# Patient Record
Sex: Female | Born: 1985 | Race: White | Hispanic: No | Marital: Single | Smoking: Current every day smoker
Health system: Southern US, Community
[De-identification: ages and names within clinical notes are randomized; demographics above are authoritative.]

## PROBLEM LIST (undated history)

## (undated) DIAGNOSIS — Z789 Other specified health status: Secondary | ICD-10-CM

---
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
## 2004-02-17 ENCOUNTER — Inpatient Hospital Stay (HOSPITAL_COMMUNITY): Admission: AD | Admit: 2004-02-17 | Discharge: 2004-02-17 | Payer: Self-pay | Admitting: Family Medicine

## 2004-02-22 ENCOUNTER — Inpatient Hospital Stay (HOSPITAL_COMMUNITY): Admission: AD | Admit: 2004-02-22 | Discharge: 2004-02-22 | Payer: Self-pay | Admitting: Obstetrics and Gynecology

## 2004-02-24 ENCOUNTER — Other Ambulatory Visit: Admission: RE | Admit: 2004-02-24 | Discharge: 2004-02-24 | Payer: Self-pay | Admitting: Obstetrics and Gynecology

## 2004-04-11 ENCOUNTER — Inpatient Hospital Stay (HOSPITAL_COMMUNITY): Admission: AD | Admit: 2004-04-11 | Discharge: 2004-04-11 | Payer: Self-pay | Admitting: Obstetrics and Gynecology

## 2004-04-17 ENCOUNTER — Inpatient Hospital Stay (HOSPITAL_COMMUNITY): Admission: AD | Admit: 2004-04-17 | Discharge: 2004-04-17 | Payer: Self-pay | Admitting: Obstetrics and Gynecology

## 2004-07-10 ENCOUNTER — Inpatient Hospital Stay (HOSPITAL_COMMUNITY): Admission: AD | Admit: 2004-07-10 | Discharge: 2004-07-10 | Payer: Self-pay | Admitting: Obstetrics & Gynecology

## 2004-07-20 ENCOUNTER — Inpatient Hospital Stay (HOSPITAL_COMMUNITY): Admission: AD | Admit: 2004-07-20 | Discharge: 2004-07-20 | Payer: Self-pay | Admitting: Obstetrics & Gynecology

## 2004-09-15 ENCOUNTER — Inpatient Hospital Stay (HOSPITAL_COMMUNITY): Admission: AD | Admit: 2004-09-15 | Discharge: 2004-09-15 | Payer: Self-pay | Admitting: Obstetrics and Gynecology

## 2004-10-07 ENCOUNTER — Inpatient Hospital Stay (HOSPITAL_COMMUNITY): Admission: AD | Admit: 2004-10-07 | Discharge: 2004-10-09 | Payer: Self-pay | Admitting: Obstetrics & Gynecology

## 2006-03-27 ENCOUNTER — Inpatient Hospital Stay (HOSPITAL_COMMUNITY): Admission: AD | Admit: 2006-03-27 | Discharge: 2006-03-27 | Payer: Self-pay | Admitting: Gynecology

## 2006-06-30 ENCOUNTER — Inpatient Hospital Stay (HOSPITAL_COMMUNITY): Admission: AD | Admit: 2006-06-30 | Discharge: 2006-06-30 | Payer: Self-pay | Admitting: Obstetrics & Gynecology

## 2006-07-16 ENCOUNTER — Emergency Department (HOSPITAL_COMMUNITY): Admission: EM | Admit: 2006-07-16 | Discharge: 2006-07-16 | Payer: Self-pay | Admitting: Family Medicine

## 2007-02-10 ENCOUNTER — Emergency Department (HOSPITAL_COMMUNITY): Admission: EM | Admit: 2007-02-10 | Discharge: 2007-02-10 | Payer: Self-pay | Admitting: Emergency Medicine

## 2007-07-07 ENCOUNTER — Inpatient Hospital Stay (HOSPITAL_COMMUNITY): Admission: AD | Admit: 2007-07-07 | Discharge: 2007-07-07 | Payer: Self-pay | Admitting: Obstetrics & Gynecology

## 2007-07-28 ENCOUNTER — Inpatient Hospital Stay (HOSPITAL_COMMUNITY): Admission: RE | Admit: 2007-07-28 | Discharge: 2007-07-28 | Payer: Self-pay | Admitting: Obstetrics & Gynecology

## 2007-08-18 ENCOUNTER — Inpatient Hospital Stay (HOSPITAL_COMMUNITY): Admission: AD | Admit: 2007-08-18 | Discharge: 2007-08-18 | Payer: Self-pay | Admitting: Obstetrics and Gynecology

## 2007-11-18 ENCOUNTER — Inpatient Hospital Stay (HOSPITAL_COMMUNITY): Admission: AD | Admit: 2007-11-18 | Discharge: 2007-11-18 | Payer: Self-pay | Admitting: Obstetrics and Gynecology

## 2008-03-02 ENCOUNTER — Inpatient Hospital Stay (HOSPITAL_COMMUNITY): Admission: AD | Admit: 2008-03-02 | Discharge: 2008-03-03 | Payer: Self-pay | Admitting: Obstetrics and Gynecology

## 2008-03-02 ENCOUNTER — Encounter (INDEPENDENT_AMBULATORY_CARE_PROVIDER_SITE_OTHER): Payer: Self-pay | Admitting: Obstetrics and Gynecology

## 2008-07-18 ENCOUNTER — Emergency Department (HOSPITAL_COMMUNITY): Admission: EM | Admit: 2008-07-18 | Discharge: 2008-07-18 | Payer: Self-pay | Admitting: Family Medicine

## 2009-02-07 ENCOUNTER — Inpatient Hospital Stay (HOSPITAL_COMMUNITY): Admission: AD | Admit: 2009-02-07 | Discharge: 2009-02-08 | Payer: Self-pay | Admitting: Obstetrics & Gynecology

## 2009-02-08 ENCOUNTER — Emergency Department (HOSPITAL_COMMUNITY): Admission: EM | Admit: 2009-02-08 | Discharge: 2009-02-08 | Payer: Self-pay | Admitting: Emergency Medicine

## 2010-04-29 ENCOUNTER — Encounter: Payer: Self-pay | Admitting: *Deleted

## 2010-07-11 LAB — URINALYSIS, ROUTINE W REFLEX MICROSCOPIC
Glucose, UA: NEGATIVE mg/dL
Glucose, UA: NEGATIVE mg/dL
Hgb urine dipstick: NEGATIVE
Nitrite: NEGATIVE
Protein, ur: NEGATIVE mg/dL
Specific Gravity, Urine: 1.03 (ref 1.005–1.030)
Urobilinogen, UA: 0.2 mg/dL (ref 0.0–1.0)
pH: 5.5 (ref 5.0–8.0)

## 2010-07-11 LAB — URINE MICROSCOPIC-ADD ON

## 2010-08-21 NOTE — Discharge Summary (Signed)
Kristine [name], [name]               ACCOUNT NO.:  [id number]   MEDICAL RECORD NO.:  [id number]          PATIENT TYPE:  INP   LOCATION:  9143                          FACILITY:  [facility]   PHYSICIAN:  [name], [name] DATE OF BIRTH:  11-27-1985   DATE OF ADMISSION:  03/02/2008  DATE OF DISCHARGE:  03/03/2008                               DISCHARGE SUMMARY   A 25 year old white female para 1-0-2-1, gravida 4, 40 weeks and 3 days,  seen with contractions of increasing intensity and frequency.  No  leakage of fluid.  No vaginal bleeding.  Good fetal movement.  Uncomplicated prenatal course, except for positive antibody screen after  RhoGAM secondary to a first-trimester vaginal bleed.  Repeat screen was  negative.  The patient stated that she wanted a tubal ligation.   PAST MEDICAL HISTORY:  Negative.   SURGICAL HISTORY:  D&C x2.   OBSTETRICAL HISTORY:  First pregnancy, spontaneous abortion, D&C.  Second pregnancy, therapeutic abortion.  Third pregnancy, spontaneous  vaginal delivery, 6 pounds 9 ounce female.  The patient during this  pregnancy had a subchorionic hemorrhage with vaginal bleeding.  She had  no history of abnormal Paps or STDs.   MEDICATIONS:  Over-the-counter acid reducer.   SOCIAL HISTORY:  She did smoke.   FAMILY HISTORY:  Positive history of coronary artery disease in the  paternal grandfather.  High blood pressure in paternal grandmother.  Skin cancer in the mother.  Lung cancer, paternal grandmother.   She was O negative with a positive antibody screen.  Pap smear was  normal.  GC and chlamydia, negative; RPR nonreactive; rubella immune;  cystic fibrosis, negative; hepatitis B surface antigen, negative; HIV  negative; AFP normal; 1-hour Glucola 81; and Group B strep negative.   Ultrasound on October 05, 2007, placed.  Her EDC is February 27, 2008.  Aug 18, 2007, ultrasound showed a normal nuchal translucency.   PHYSICAL EXAMINATION:  GENERAL:  On admission, the  patient was afebrile  with normal vital signs.  HEART/LUNGS:  Normal.  ABDOMEN:  Soft and nontender.  Fetal heart tones were normal.  Cervix  was 3 cm, 80% vertex at a -1.   The patient was started on Pitocin.  Artificial rupture of membranes was  done with a small amount of clear fluid.  By 12:40 p.m., she had no  complaints.  She was comfortable with an epidural.  Cervix was 6 cm,  90%.  At 1:30 p.m., the cervix was 9 cm, 100%.  At 2:36 p.m., the  patient then progressed to full dilatation of +1 station.  She pushed  for approximately 10 minutes to deliver spontaneously a living female  infant, 7 pounds with Apgars of 6 at one at 8 at five minutes.  Infant  had a nuchal cord.  Placenta was manually extracted, inspected, and was  complete.  The patient was placed on cefotetan for 24 hours.  Second-  degree perineal laceration and left labial lacerations repaired with 3-0  Vicryl.  Blood loss was less than 500 mL.  The patient requested early  discharge because of Thanksgiving  and was discharged on the first  postpartum day.   Comprehensive metabolic profile was normal.  LDH was 141, SGOT 20, SGPT  12, and uric acid was 4.5.  Hemoglobin on admission 11.9, hematocrit  34.6, white count 18,200, and platelet count 214,000.  RPR was  nonreactive.  Follow up hemoglobin 10.5 and hematocrit 30.6.  The  patient's antibody screen is pending.  The baby is Rh positive and she  has got a negative screen.  She will receive RhoGAM prior to discharge.   FINAL DIAGNOSES:  Intrauterine pregnancy at 40+ weeks, delivered vertex.   OPERATION:  Spontaneous delivery vertex, repair of second-degree  perineal, and labial lacerations.   FINAL CONDITION:  Improved.   INSTRUCTIONS:  Our regular discharge instruction booklet.   MEDICATIONS:  1. Percocet 5/325, 20 tablets one every 4-6 hours as needed for pain.  2. Motrin 600 mg 20 tablets one every 6 hours as needed for pain.   FOLLOWUP:  The patient is  advised to return in 6 weeks for followup  examination.      [name], [name]  Electronically Signed     TFH/MEDQ  D:  03/03/2008  T:  03/03/2008  Job:  [id number]

## 2010-08-24 NOTE — Op Note (Signed)
NAME:  Kristine [name], Kristine [name]                         ACCOUNT NO.:  [id number]   MEDICAL RECORD NO.:  [id number]                   PATIENT TYPE:  AMB   LOCATION:  SDC                                  FACILITY:  [facility]   PHYSICIAN:  Freddy Finner, [name]                DATE OF BIRTH:  July 11, 1985   DATE OF PROCEDURE:  02/12/2003  DATE OF DISCHARGE:                                 OPERATIVE REPORT   PREOPERATIVE DIAGNOSIS:  Missed abortion.   POSTOPERATIVE DIAGNOSIS:  Missed abortion.   OPERATIVE PROCEDURE:  Dilation and evacuation.   SURGEON:  Freddy Finner, [name]   ANESTHESIA:  Managed intravenous sedation and paracervical block.   INTRAOPERATIVE COMPLICATIONS:  None.   ESTIMATED INTRAOPERATIVE BLOOD LOSS:  Less than 150 mL.   INTRAOPERATIVE COMPLICATIONS:  None.   The patient is a 25 year old who presented on February 11, 2003, with some  vaginal spotting.  She had a pelvic ultrasound showing an empty gestational  sac within the uterine cavity.  She is known to be Rh negative, and she was  given RhoGAM on the evening prior to surgery.  Due to the scant amount of  bleeding and the desirability of scheduled surgery after being NPO, she was  brought in this morning for D&E.   She was admitted on the morning of surgery, brought to the operating room,  there placed under adequate intravenous sedation, placed in the dorsal  lithotomy position using the [facility] stirrup system.  Betadine prep of the  mons, perineum, and vagina was carried out in the usual fashion.  A bivalve  speculum was introduced and the cervix was visualized.  Paracervical block  was placed using a total of 10 mL of 1% Xylocaine.  Injections were made at  4 and 8 o'clock in the vaginal fornices.  The cervix was grasped on the  anterior lip with a single-tooth tenaculum.  The cervix was then  progressively dilated to 19 with Pratt dilators.  The cervix was very rigid  and resistant to dilatation.  Attempts with the 21 Pratt  were unsuccessful,  producing a small tear on the anterior cervix as the single-tooth tenaculum  pulled off.  For this reason it was elected to explore the uterus with a  curette.  This was done and gentle sharp curettage and exploration with  polyp forceps was continued until it was felt that the cavity was completely  evacuated.  The procedure was then terminated.  A small amount of 1%  Xylocaine was injected to the small laceration on the anterior cervix, which  produced complete hemostasis.  At this point the procedure was terminated,  all instruments were removed, and the patient was taken to the recovery room  in good condition.  She will be discharged in the immediate postoperative  period for follow-up in the office or in the clinic in approximately one  week.  She is to use ibuprofen or Tylenol as needed for postoperative pain.                                              Freddy Finner, [name]   WRN/MEDQ  D:  02/12/2003  T:  02/12/2003  Job:  [id number]

## 2010-12-31 LAB — WET PREP, GENITAL
Clue Cells Wet Prep HPF POC: NONE SEEN
Trich, Wet Prep: NONE SEEN

## 2010-12-31 LAB — RH IMMUNE GLOBULIN WORKUP (NOT WOMEN'S HOSP)

## 2010-12-31 LAB — URINALYSIS, ROUTINE W REFLEX MICROSCOPIC
Glucose, UA: NEGATIVE
Hgb urine dipstick: NEGATIVE
Ketones, ur: NEGATIVE
Nitrite: NEGATIVE
Protein, ur: NEGATIVE
Urobilinogen, UA: 0.2

## 2010-12-31 LAB — GC/CHLAMYDIA PROBE AMP, GENITAL
Chlamydia, DNA Probe: NEGATIVE
GC Probe Amp, Genital: NEGATIVE

## 2010-12-31 LAB — POCT PREGNANCY, URINE
Operator id: 251141
Preg Test, Ur: POSITIVE

## 2010-12-31 LAB — HCG, QUANTITATIVE, PREGNANCY: hCG, Beta Chain, Quant, S: 9912 — ABNORMAL HIGH

## 2010-12-31 LAB — ABO/RH: ABO/RH(D): O NEG

## 2011-01-04 LAB — URINE MICROSCOPIC-ADD ON

## 2011-01-04 LAB — URINE CULTURE
Colony Count: NO GROWTH
Culture: NO GROWTH

## 2011-01-04 LAB — CBC
HCT: 34.3 — ABNORMAL LOW
MCHC: 34.4
MCV: 97.7
Platelets: 200
RDW: 13.7

## 2011-01-04 LAB — URINALYSIS, ROUTINE W REFLEX MICROSCOPIC: Protein, ur: 30 — AB

## 2011-01-08 LAB — CBC
HCT: 30.6 — ABNORMAL LOW
MCHC: 34.3
MCHC: 34.3
MCV: 91.3
MCV: 91.7
Platelets: 180
Platelets: 214
RBC: 3.33 — ABNORMAL LOW
RDW: 13.5
WBC: 14.8 — ABNORMAL HIGH

## 2011-01-08 LAB — COMPREHENSIVE METABOLIC PANEL
ALT: 12
AST: 20
Alkaline Phosphatase: 231 — ABNORMAL HIGH
CO2: 21
GFR calc Af Amer: >60
GFR calc non Af Amer: >60
Glucose, Bld: 89
Potassium: 4.2
Sodium: 135

## 2011-01-08 LAB — LACTATE DEHYDROGENASE: LDH: 141

## 2011-01-08 LAB — RH IMMUNE GLOB WKUP(>/=20WKS)(NOT WOMEN'S HOSP)

## 2011-01-22 ENCOUNTER — Emergency Department (HOSPITAL_COMMUNITY)
Admission: EM | Admit: 2011-01-22 | Discharge: 2011-01-23 | Disposition: A | Discharge status: Home / Self Care | Payer: No Typology Code available for payment source | Attending: Emergency Medicine | Admitting: Emergency Medicine

## 2011-01-22 DIAGNOSIS — M542 Cervicalgia: Secondary | ICD-10-CM | POA: Insufficient documentation

## 2011-01-22 DIAGNOSIS — M546 Pain in thoracic spine: Secondary | ICD-10-CM | POA: Insufficient documentation

## 2011-01-23 ENCOUNTER — Emergency Department (HOSPITAL_COMMUNITY): Payer: No Typology Code available for payment source

## 2011-02-09 ENCOUNTER — Emergency Department (HOSPITAL_COMMUNITY)
Admission: EM | Admit: 2011-02-09 | Discharge: 2011-02-10 | Disposition: A | Discharge status: Home / Self Care | Payer: No Typology Code available for payment source | Attending: Emergency Medicine | Admitting: Emergency Medicine

## 2011-02-09 ENCOUNTER — Emergency Department (HOSPITAL_COMMUNITY): Payer: No Typology Code available for payment source

## 2011-02-09 DIAGNOSIS — R209 Unspecified disturbances of skin sensation: Secondary | ICD-10-CM | POA: Insufficient documentation

## 2011-02-09 DIAGNOSIS — M542 Cervicalgia: Secondary | ICD-10-CM | POA: Insufficient documentation

## 2011-02-09 DIAGNOSIS — M62838 Other muscle spasm: Secondary | ICD-10-CM | POA: Insufficient documentation

## 2011-02-09 DIAGNOSIS — S139XXA Sprain of joints and ligaments of unspecified parts of neck, initial encounter: Secondary | ICD-10-CM | POA: Insufficient documentation

## 2011-02-09 DIAGNOSIS — M2560 Stiffness of unspecified joint, not elsewhere classified: Secondary | ICD-10-CM | POA: Insufficient documentation

## 2012-01-26 ENCOUNTER — Emergency Department (HOSPITAL_COMMUNITY)
Admission: EM | Admit: 2012-01-26 | Discharge: 2012-01-26 | Disposition: A | Discharge status: Home / Self Care | Payer: Self-pay | Attending: Emergency Medicine | Admitting: Emergency Medicine

## 2012-01-26 ENCOUNTER — Encounter (HOSPITAL_COMMUNITY): Payer: Self-pay | Admitting: *Deleted

## 2012-01-26 ENCOUNTER — Emergency Department (HOSPITAL_COMMUNITY): Payer: Self-pay

## 2012-01-26 DIAGNOSIS — R062 Wheezing: Secondary | ICD-10-CM | POA: Insufficient documentation

## 2012-01-26 DIAGNOSIS — R509 Fever, unspecified: Secondary | ICD-10-CM | POA: Insufficient documentation

## 2012-01-26 DIAGNOSIS — F17200 Nicotine dependence, unspecified, uncomplicated: Secondary | ICD-10-CM | POA: Insufficient documentation

## 2012-01-26 DIAGNOSIS — R059 Cough, unspecified: Secondary | ICD-10-CM | POA: Insufficient documentation

## 2012-01-26 DIAGNOSIS — J40 Bronchitis, not specified as acute or chronic: Secondary | ICD-10-CM | POA: Insufficient documentation

## 2012-01-26 DIAGNOSIS — R05 Cough: Secondary | ICD-10-CM | POA: Insufficient documentation

## 2012-01-26 LAB — POCT PREGNANCY, URINE: Preg Test, Ur: NEGATIVE

## 2012-01-26 MED ORDER — AZITHROMYCIN 250 MG PO TABS
500.0000 mg | ORAL_TABLET | Freq: Once | ORAL | Status: AC
  Administered 2012-01-26: 500 mg via ORAL
  Filled 2012-01-26: qty 2

## 2012-01-26 MED ORDER — AZITHROMYCIN 250 MG PO TABS: 250.0000 mg | ORAL_TABLET | Freq: Every day | ORAL | Status: DC

## 2012-01-26 MED ORDER — ALBUTEROL SULFATE HFA 108 (90 BASE) MCG/ACT IN AERS
2.0000 | INHALATION_SPRAY | RESPIRATORY_TRACT | Status: DC | PRN
  Administered 2012-01-26: 2 via RESPIRATORY_TRACT
  Filled 2012-01-26: qty 6.7

## 2012-01-26 NOTE — ED Provider Notes (Signed)
History     CSN: [id number]  Arrival date & time 01/26/12  1558   First MD Initiated Contact with Patient 01/26/12 1644      Chief Complaint  Patient presents with  . URI    (Consider location/radiation/quality/duration/timing/severity/associated sxs/prior treatment) HPI Comments: Patient reports, that she's had a routine URI for the past 3, weeks, but for the past 2, days.  She has developed a productive cough with green, blood tinged sputum, generalized myalgias, and low-grade fever at home.  She also reports, that she's had 2 months of left breast tenderness.  She currently is using Mirena or birth control.  She denies any vaginal discharge, or change in sexual partner  Patient is a 26 y.o. female presenting with URI. The history is provided by the patient.  URI The primary symptoms include fever, cough and wheezing. Primary symptoms do not include nausea or rash.  Symptoms associated with the illness include chills.    History reviewed. No pertinent past medical history.  History reviewed. No pertinent past surgical history.  History reviewed. No pertinent family history.  History  Substance Use Topics  . Smoking status: Current Every Day Smoker -- 1.0 packs/day for 10 years    Types: Cigarettes  . Smokeless tobacco: Not on file  . Alcohol Use: No    OB History    Grav Para Term Preterm Abortions TAB SAB Ect Mult Living                  Review of Systems  Constitutional: Positive for fever and chills.  Respiratory: Positive for cough and wheezing. Negative for shortness of breath.   Gastrointestinal: Negative for nausea.  Skin: Negative for rash and wound.    Allergies  Review of patient's allergies indicates no known allergies.  Home Medications   Current Outpatient Rx  Name Route Sig Dispense Refill  . ACETAMINOPHEN 500 MG PO TABS Oral Take 500 mg by mouth every 6 (six) hours as needed. Pain    . DM-DOXYLAMINE-ACETAMINOPHEN 15-6.25-325 MG PO CAPS Oral  Take 1 capsule by mouth daily.    . AZITHROMYCIN 250 MG PO TABS Oral Take 1 tablet (250 mg total) by mouth daily. 4 tablet 0    BP 123/74  Pulse 70  Temp 98.7 F (37.1 C) (Oral)  Resp 23  SpO2 100%  LMP 01/12/2012  Physical Exam  Constitutional: She appears well-developed and well-nourished.  HENT:  Head: Normocephalic.  Neck: Normal range of motion.  Cardiovascular: Normal rate.   Pulmonary/Chest: Effort normal. No respiratory distress. She has wheezes. She exhibits no tenderness.  Abdominal: Soft.  Musculoskeletal: Normal range of motion.  Neurological: She is alert.  Skin: Skin is warm. No erythema.    ED Course  Procedures (including critical care time)   Labs Reviewed  POCT PREGNANCY, URINE   Dg Chest 2 View  01/26/2012  *RADIOLOGY REPORT*  Clinical Data: Productive cough.  CHEST - 2 VIEW  Comparison: Thoracic spine 01/23/2011  Findings: Two views of the chest demonstrate hyperinflation of the lungs. Heart and mediastinum are within normal limits.  The trachea is midline.  No focal airspace disease and no evidence for effusions.  IMPRESSION: Hyperinflation without focal disease.   Original Report Authenticated By: Richarda Overlie, [name]      1. Bronchitis       MDM          Arman Filter, NP 01/26/12 1826

## 2012-01-26 NOTE — ED Notes (Addendum)
Pt report she has cold x3 weeks.  body aches, chills, weakness. Last night had fever, took tylenol. At present Green productive cough. Generalized pain 5/10.   Unrelated pt reports left breast has been slightly sore and tender to touch.

## 2012-01-27 NOTE — ED Provider Notes (Signed)
Medical screening examination/treatment/procedure(s) were performed by non-physician practitioner and as supervising physician I was immediately available for consultation/collaboration.  Yasheka Fossett R. Suhas Estis, MD 01/27/12 0013 

## 2012-11-09 ENCOUNTER — Emergency Department (INDEPENDENT_AMBULATORY_CARE_PROVIDER_SITE_OTHER)
Admission: EM | Admit: 2012-11-09 | Discharge: 2012-11-09 | Disposition: A | Discharge status: Home / Self Care | Payer: Self-pay | Source: Home / Self Care | Attending: Family Medicine | Admitting: Family Medicine

## 2012-11-09 ENCOUNTER — Encounter (HOSPITAL_COMMUNITY): Payer: Self-pay | Admitting: Emergency Medicine

## 2012-11-09 DIAGNOSIS — M7650 Patellar tendinitis, unspecified knee: Secondary | ICD-10-CM

## 2012-11-09 DIAGNOSIS — M7651 Patellar tendinitis, right knee: Secondary | ICD-10-CM

## 2012-11-09 MED ORDER — DICLOFENAC POTASSIUM 50 MG PO TABS: 50.0000 mg | ORAL_TABLET | Freq: Three times a day (TID) | ORAL | Status: DC

## 2012-11-09 NOTE — ED Provider Notes (Signed)
  CSN: [id number]     Arrival date & time 11/09/12  1303 History     First MD Initiated Contact with Patient 11/09/12 1417     Chief Complaint  Patient presents with  . Knee Pain   (Consider location/radiation/quality/duration/timing/severity/associated sxs/prior Treatment) Patient is a 27 y.o. female presenting with knee pain. The history is provided by the patient.  Knee Pain Location:  Knee (did a lot of squats yest painting doors at home) Injury: no   Knee location:  R knee Pain details:    Quality:  Burning   Radiates to:  Does not radiate   Severity:  Mild   Onset quality:  Gradual   Duration:  1 day   Progression:  Worsening Chronicity:  New Dislocation: no   Prior injury to area:  No Associated symptoms: no back pain     History reviewed. No pertinent past medical history. History reviewed. No pertinent past surgical history. History reviewed. No pertinent family history. History  Substance Use Topics  . Smoking status: Current Every Day Smoker -- 1.00 packs/day for 10 years    Types: Cigarettes  . Smokeless tobacco: Not on file  . Alcohol Use: No   OB History   Grav Para Term Preterm Abortions TAB SAB Ect Mult Living                 Review of Systems  Constitutional: Negative.   Musculoskeletal: Negative for back pain, joint swelling and gait problem.  Skin: Negative.     Allergies  Review of patient's allergies indicates no known allergies.  Home Medications   Current Outpatient Rx  Name  Route  Sig  Dispense  Refill  . acetaminophen (TYLENOL) 500 MG tablet   Oral   Take 500 mg by mouth every 6 (six) hours as needed. Pain         . azithromycin (ZITHROMAX Z-PAK) 250 MG tablet   Oral   Take 1 tablet (250 mg total) by mouth daily.   4 tablet   0   . diclofenac (CATAFLAM) 50 MG tablet   Oral   Take 1 tablet (50 mg total) by mouth 3 (three) times daily. For knee pain.   30 tablet   1   . DM-Doxylamine-Acetaminophen (VICKS NYQUIL  MULTI-SYMPTOM) 15-6.25-325 MG CAPS   Oral   Take 1 capsule by mouth daily.          BP 103/61  Pulse 64  Temp(Src) 97.8 F (36.6 C) (Oral)  Resp 16  SpO2 100%  LMP 10/26/2012 Physical Exam  Nursing note and vitals reviewed. Constitutional: She is oriented to person, place, and time. She appears well-developed and well-nourished.  Musculoskeletal: She exhibits tenderness.       Right knee: She exhibits normal range of motion, no swelling, no effusion, no LCL laxity, normal patellar mobility, no bony tenderness, normal meniscus and no MCL laxity. Tenderness found. Patellar tendon tenderness noted.       Legs: Neurological: She is alert and oriented to person, place, and time.  Skin: Skin is warm and dry.    ED Course   Procedures (including critical care time)  Labs Reviewed - No data to display No results found. 1. Tendonitis, patellar, right     MDM    Linna Hoff, MD 11/09/12 1452

## 2012-11-09 NOTE — ED Notes (Signed)
Report pain in right knee. 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 Pain started after work last night in her knee.  Pt describes the pain a constant throbbing. With waking this a.m pt was unable to bear full weight on knee "gave away".  Pt has been using ibuprofen and muscle relaxers with no relief in pain.

## 2012-11-13 ENCOUNTER — Encounter (HOSPITAL_COMMUNITY): Payer: Self-pay | Admitting: *Deleted

## 2012-11-13 ENCOUNTER — Emergency Department (HOSPITAL_COMMUNITY): Payer: Self-pay

## 2012-11-13 ENCOUNTER — Emergency Department (HOSPITAL_COMMUNITY)
Admission: EM | Admit: 2012-11-13 | Discharge: 2012-11-13 | Disposition: A | Discharge status: Home / Self Care | Payer: Self-pay | Attending: Emergency Medicine | Admitting: Emergency Medicine

## 2012-11-13 DIAGNOSIS — R110 Nausea: Secondary | ICD-10-CM | POA: Insufficient documentation

## 2012-11-13 DIAGNOSIS — Z3202 Encounter for pregnancy test, result negative: Secondary | ICD-10-CM | POA: Insufficient documentation

## 2012-11-13 DIAGNOSIS — F17200 Nicotine dependence, unspecified, uncomplicated: Secondary | ICD-10-CM | POA: Insufficient documentation

## 2012-11-13 DIAGNOSIS — R109 Unspecified abdominal pain: Secondary | ICD-10-CM | POA: Insufficient documentation

## 2012-11-13 DIAGNOSIS — K5900 Constipation, unspecified: Secondary | ICD-10-CM | POA: Insufficient documentation

## 2012-11-13 DIAGNOSIS — Z79899 Other long term (current) drug therapy: Secondary | ICD-10-CM | POA: Insufficient documentation

## 2012-11-13 LAB — CBC WITH DIFFERENTIAL/PLATELET
Basophils Relative: 0 % (ref 0–1)
Eosinophils Absolute: 0.1 10*3/uL (ref 0.0–0.7)
Eosinophils Relative: 1 % (ref 0–5)
HCT: 41.6 % (ref 36.0–46.0)
Hemoglobin: 14.6 g/dL (ref 12.0–15.0)
Lymphs Abs: 1.3 10*3/uL (ref 0.7–4.0)
MCH: 33 pg (ref 26.0–34.0)
MCHC: 35.1 g/dL (ref 30.0–36.0)
MCV: 93.9 fL (ref 78.0–100.0)
Monocytes Absolute: 0.5 10*3/uL (ref 0.1–1.0)
Monocytes Relative: 5 % (ref 3–12)
RBC: 4.43 MIL/uL (ref 3.87–5.11)

## 2012-11-13 LAB — COMPREHENSIVE METABOLIC PANEL
ALT: 13 U/L (ref 0–35)
AST: 17 U/L (ref 0–37)
Alkaline Phosphatase: 68 U/L (ref 39–117)
Calcium: 9.2 mg/dL (ref 8.4–10.5)
GFR calc Af Amer: >90 mL/min (ref >90)
Glucose, Bld: 102 mg/dL — ABNORMAL HIGH (ref 70–99)
Potassium: 3.9 mEq/L (ref 3.5–5.1)
Sodium: 139 mEq/L (ref 135–145)
Total Protein: 7.4 g/dL (ref 6.0–8.3)

## 2012-11-13 LAB — URINALYSIS, ROUTINE W REFLEX MICROSCOPIC
Bilirubin Urine: NEGATIVE
Glucose, UA: NEGATIVE mg/dL
Hgb urine dipstick: NEGATIVE
Protein, ur: NEGATIVE mg/dL
Urobilinogen, UA: 0.2 mg/dL (ref 0.0–1.0)

## 2012-11-13 LAB — PREGNANCY, URINE: Preg Test, Ur: NEGATIVE

## 2012-11-13 MED ORDER — IBUPROFEN 800 MG PO TABS
800.0000 mg | ORAL_TABLET | Freq: Once | ORAL | Status: AC
  Administered 2012-11-13: 800 mg via ORAL
  Filled 2012-11-13: qty 1

## 2012-11-13 MED ORDER — SODIUM CHLORIDE 0.9 % IV BOLUS (SEPSIS)
1000.0000 mL | Freq: Once | INTRAVENOUS | Status: AC
  Administered 2012-11-13: 1000 mL via INTRAVENOUS

## 2012-11-13 MED ORDER — HYDROMORPHONE HCL PF 1 MG/ML IJ SOLN
1.0000 mg | Freq: Once | INTRAMUSCULAR | Status: AC
  Administered 2012-11-13: 1 mg via INTRAVENOUS
  Filled 2012-11-13: qty 1

## 2012-11-13 MED ORDER — SIMETHICONE 80 MG PO CHEW
160.0000 mg | CHEWABLE_TABLET | Freq: Once | ORAL | Status: AC
  Administered 2012-11-13: 160 mg via ORAL
  Filled 2012-11-13: qty 2

## 2012-11-13 MED ORDER — IOHEXOL 300 MG/ML  SOLN
100.0000 mL | Freq: Once | INTRAMUSCULAR | Status: AC | PRN
  Administered 2012-11-13: 80 mL via INTRAVENOUS

## 2012-11-13 MED ORDER — BISACODYL 5 MG PO TBEC: 5.0000 mg | DELAYED_RELEASE_TABLET | Freq: Every day | ORAL | Status: DC | PRN

## 2012-11-13 MED ORDER — FENTANYL CITRATE 0.05 MG/ML IJ SOLN: 100.0000 ug | Freq: Once | INTRAMUSCULAR | Status: DC

## 2012-11-13 MED ORDER — IOHEXOL 300 MG/ML  SOLN
50.0000 mL | Freq: Once | INTRAMUSCULAR | Status: AC | PRN
  Administered 2012-11-13: 50 mL via ORAL

## 2012-11-13 MED ORDER — ONDANSETRON HCL 4 MG/2ML IJ SOLN
4.0000 mg | Freq: Once | INTRAMUSCULAR | Status: AC
  Administered 2012-11-13: 4 mg via INTRAVENOUS
  Filled 2012-11-13: qty 2

## 2012-11-13 NOTE — ED Notes (Signed)
Urine recollected and sent to lab for testing.

## 2012-11-13 NOTE — Progress Notes (Signed)
Patient confirms that she does not have a pcp or insurance.  [facility] provided patient a  list of pcps who accept self pay patients, list of discount pharmacies, website needymeds.org and provided RX discount card for medication assistance.  Also provided information regarding Affordable Care Act and Medicaid for insurance.  EDCM also provided patient information for orange card.  Explained to patient that the orange card is not insurance.  Also provided patient a list of financial community assistance such as local churches and [employer].  Strongly encouraged patient to find a pcp.  Patient verbalized understanding.  No further needs at this time.

## 2012-11-13 NOTE — ED Provider Notes (Signed)
CSN: [id number]     Arrival date & time 11/13/12  1849 History     First MD Initiated Contact with Patient 11/13/12 1852     Chief Complaint  Patient presents with  . Abdominal Pain   (Consider location/radiation/quality/duration/timing/severity/associated sxs/prior Treatment) HPI Kristine [name] is a 27 y.o.female without any significant PMH presents to the ER with complaints of abdominal pain that started early today and is severe with nausea. She admits to taking a friend muscle relaxers x 2 and is concerned that this might have caused constipation. Her LBM was yesterday. The pain started in the RUQ but has moved to the epigastric and periumbical region. It is waxing and waning but getting stronger. She denies ever having an abdominal surgery in the past or hx of abdominal pains. She tried using a laxative, enema, prune juice and OTC medications to control pain but denies these being effective.    History reviewed. No pertinent past medical history. History reviewed. No pertinent past surgical history. No family history on file. History  Substance Use Topics  . Smoking status: Current Every Day Smoker -- 1.00 packs/day for 10 years    Types: Cigarettes  . Smokeless tobacco: Not on file  . Alcohol Use: No   OB History   Grav Para Term Preterm Abortions TAB SAB Ect Mult Living                 Review of Systems  Review of Systems  Gen: no weight loss, fevers, chills, night sweats  Eyes: no discharge or drainage, no occular pain or visual changes  Nose: no epistaxis or rhinorrhea  Mouth: no dental pain, no sore throat  Neck: no neck pain  Lungs:No wheezing, coughing or hemoptysis CV: no chest pain, palpitations, dependent edema or orthopnea  Abd: + abdominal pain, nausea,  NO vomiting  GU: no dysuria or gross hematuria  MSK:  No abnormalities  Neuro: no headache, no focal neurologic deficits  Skin: no abnormalities Psyche: negative.   Allergies  Review of patient's  allergies indicates no known allergies.  Home Medications   Current Outpatient Rx  Name  Route  Sig  Dispense  Refill  . acetaminophen (TYLENOL) 500 MG tablet   Oral   Take 500 mg by mouth every 6 (six) hours as needed. Pain         . cyclobenzaprine (FLEXERIL) 10 MG tablet   Oral   Take 10 mg by mouth 3 (three) times daily as needed for muscle spasms.         . sodium phosphate Pediatric (FLEET) 3.5-9.5 GM/59ML enema   Rectal   Place 1 enema rectally once.         . bisacodyl (DULCOLAX) 5 MG EC tablet   Oral   Take 1 tablet (5 mg total) by mouth daily as needed for constipation.   14 tablet   0   . diclofenac (CATAFLAM) 50 MG tablet   Oral   Take 1 tablet (50 mg total) by mouth 3 (three) times daily. For knee pain.   30 tablet   1    BP 124/77  Pulse 90  Temp(Src) 99.8 F (37.7 C) (Oral)  Resp 20  SpO2 100%  LMP 10/26/2012 Physical Exam  Nursing note and vitals reviewed. Constitutional: She appears well-developed and well-nourished. No distress.  HENT:  Head: Normocephalic and atraumatic.  Eyes: Pupils are equal, round, and reactive to light.  Neck: Normal range of motion. Neck supple.  Cardiovascular: Normal  rate and regular rhythm.   Pulmonary/Chest: Effort normal.  Abdominal: Soft. She exhibits no distension and no ascites. There is tenderness in the right upper quadrant, epigastric area and periumbilical area. There is no rigidity, no rebound, no CVA tenderness and negative Murphy's sign.  Genitourinary: Rectum normal.  Neurological: She is alert.  Skin: Skin is warm and dry.    ED Course   Procedures (including critical care time)  Labs Reviewed  COMPREHENSIVE METABOLIC PANEL - Abnormal; Notable for the following:    Glucose, Bld 102 (*)    All other components within normal limits  CBC WITH DIFFERENTIAL - Abnormal; Notable for the following:    WBC 11.0 (*)    Neutrophils Relative % 82 (*)    Neutro Abs 9.0 (*)    All other components  within normal limits  URINALYSIS, ROUTINE W REFLEX MICROSCOPIC - Abnormal; Notable for the following:    Ketones, ur 15 (*)    All other components within normal limits  LIPASE, BLOOD  PREGNANCY, URINE   Ct Abdomen Pelvis W Contrast  11/13/2012   *RADIOLOGY REPORT*  Clinical Data: Severe right-sided abdominal pain and nausea.  CT ABDOMEN AND PELVIS WITH CONTRAST  Technique:  Multidetector CT imaging of the abdomen and pelvis was performed following the standard protocol during bolus administration of intravenous contrast.  Contrast: 80 mL of Omnipaque 300 IV contrast  Comparison: Pelvic ultrasound performed 07/07/2007  Findings: The visualized lung bases are clear.  There is distension of the common hepatic duct to 0.8 cm in maximal diameter, with prominence of the intrahepatic biliary ducts; this is of uncertain significance.  No definite distal obstructing stone or mass is seen.  The gallbladder remains normal in size.  There is no evidence for cholecystitis.  The liver and spleen are otherwise unremarkable in appearance.  The pancreas and adrenal glands are unremarkable.  The kidneys are unremarkable in appearance.  There is no evidence of hydronephrosis.  No renal or ureteral stones are seen.  No perinephric stranding is appreciated.  No free fluid is identified.  The small bowel is unremarkable in appearance.  The stomach is within normal limits.  No acute vascular abnormalities are seen.  The appendix is normal in caliber and filled with air, seen tracking to the inferior tip of the liver.  There is no evidence for appendicitis.  The colon is partially filled with liquid stool; it is unremarkable in appearance.  The bladder is decompressed and not well assessed.  The uterus is grossly unremarkable appearance.  An intrauterine device is noted at the fundus of the uterus.  No inguinal lymphadenopathy is seen.  No acute osseous abnormalities are identified.  IMPRESSION:  1.  Distension of the common  hepatic duct to 0.8 cm in maximal diameter, with prominence of the intrahepatic biliary ducts.  This is of uncertain significance.  The gallbladder remains normal in size, and there is no evidence for cholecystitis.  No definite distal obstructing stone or mass is seen.  MRCP could be considered for further evaluation, when and as deemed clinically appropriate. 2.  Otherwise unremarkable CT of the abdomen and pelvis.   Original Report Authenticated By: Tonia Ghent, [name]   1. Abdominal pain     MDM  Patient has had 6 bowel movements since in the ED before CT abd/pelv. Has significant relief of her symptoms. She says that she is passing some gas and feels very bloated. No tenderness to RUQ as CT noted some mild biliary duct  dilatation of unknown significance. Highly down gall bladder disease with the multiple episodes of loose bowel and gas.  Will stay away from narcotics to prevent further constipation and give short course of dulcolax .  26 y.o.Tanikka Bresnan Vetsch's evaluation in the Emergency Department is complete. It has been determined that no acute conditions requiring further emergency intervention are present at this time. The patient/guardian have been advised of the diagnosis and plan. We have discussed signs and symptoms that warrant return to the ED, such as changes or worsening in symptoms.  Vital signs are stable at discharge. Filed Vitals:   11/13/12 1857  BP: 124/77  Pulse: 90  Temp: 99.8 F (37.7 C)  Resp: 20    Patient/guardian has voiced understanding and agreed to follow-up with the PCP or specialist.   Dorthula Matas, PA-C 11/13/12 2254

## 2012-11-13 NOTE — ED Notes (Signed)
Pt reports severe R side pain which started early today, pt reports nausea but denies any vomiting at this time.  Pt reports pain radiates to her mid-abdomen.  LBM-yesterday.  Pt reports using laxatives without relief.

## 2012-11-14 NOTE — ED Provider Notes (Signed)
Medical screening examination/treatment/procedure(s) were performed by non-physician practitioner and as supervising physician I was immediately available for consultation/collaboration.  Aston Lawhorn T Seann Genther, MD 11/14/12 1518 

## 2013-03-29 ENCOUNTER — Emergency Department (HOSPITAL_COMMUNITY)
Admission: EM | Admit: 2013-03-29 | Discharge: 2013-03-29 | Disposition: A | Discharge status: Home / Self Care | Payer: Self-pay | Attending: Emergency Medicine | Admitting: Emergency Medicine

## 2013-03-29 ENCOUNTER — Encounter (HOSPITAL_COMMUNITY): Payer: Self-pay | Admitting: Emergency Medicine

## 2013-03-29 DIAGNOSIS — J9801 Acute bronchospasm: Secondary | ICD-10-CM | POA: Insufficient documentation

## 2013-03-29 DIAGNOSIS — R110 Nausea: Secondary | ICD-10-CM | POA: Insufficient documentation

## 2013-03-29 DIAGNOSIS — R197 Diarrhea, unspecified: Secondary | ICD-10-CM | POA: Insufficient documentation

## 2013-03-29 DIAGNOSIS — IMO0001 Reserved for inherently not codable concepts without codable children: Secondary | ICD-10-CM | POA: Insufficient documentation

## 2013-03-29 DIAGNOSIS — F17200 Nicotine dependence, unspecified, uncomplicated: Secondary | ICD-10-CM | POA: Insufficient documentation

## 2013-03-29 DIAGNOSIS — J111 Influenza due to unidentified influenza virus with other respiratory manifestations: Secondary | ICD-10-CM | POA: Insufficient documentation

## 2013-03-29 DIAGNOSIS — R52 Pain, unspecified: Secondary | ICD-10-CM | POA: Insufficient documentation

## 2013-03-29 MED ORDER — ALBUTEROL SULFATE HFA 108 (90 BASE) MCG/ACT IN AERS
2.0000 | INHALATION_SPRAY | RESPIRATORY_TRACT | Status: DC | PRN
  Filled 2013-03-29: qty 6.7

## 2013-03-29 MED ORDER — AEROCHAMBER Z-STAT PLUS/MEDIUM MISC: 1.0000 | Freq: Once | Status: DC

## 2013-03-29 NOTE — ED Notes (Signed)
Pt c/o fever, muscle aches, productive cough since Wednesday. Pt also reports she has had some stomach cramps and diarrhea over the past few days. Pt states she has been nauseous, but hasn't vomited. Pt has been taking Tylenol and Mucinex for symptoms. Not getting better. Pt with no acute distress.

## 2013-03-29 NOTE — ED Provider Notes (Addendum)
CSN: [id number]     Arrival date & time 03/29/13  1537 History  This chart was scribed for non-physician practitioner, Earley Favor, NP, working with Raeford Razor, MD by Smiley Houseman, ED Scribe. This patient was seen in room WTR8/WTR8 and the patient's care was started at 4:02 PM.  Chief Complaint  Patient presents with  . URI  . Diarrhea    The history is provided by the patient. No language interpreter was used.   HPI Comments: Kristine [name] is a 27 y.o. female who presents to the Emergency Department complaining of constant worsening URI symptoms and associated diarrhea, weakness, congestion, sneezing, and generalized body aches, that all started at the same time.   Pt states "I feel like hell."  She reports 2 episodes daily of diarrhea described as liquidy stool.  She is nauseated but denies vomiting or concern for dehydration.  She states she has had a fever of 102F (ED temperature 98.23F) and a non-productive cough.  Pt states she has taken Tylenol, Mucinex, and Nyquil without relief.  Pt also reports possible wheezing for about 2 months ago and has been diagnosed with either bronchitis or asthma, but she does not use any breathing treatment at home. She is a current smoker.     History reviewed. No pertinent past medical history.  History reviewed. No pertinent past surgical history.  No family history on file.  History  Substance Use Topics  . Smoking status: Current Every Day Smoker -- 1.00 packs/day for 10 years    Types: Cigarettes  . Smokeless tobacco: Not on file  . Alcohol Use: No   OB History   Grav Para Term Preterm Abortions TAB SAB Ect Mult Living                 Review of Systems  Constitutional: Positive for fever and chills.  HENT: Positive for congestion and rhinorrhea.   Respiratory: Positive for cough and wheezing. Negative for shortness of breath.   Gastrointestinal: Positive for nausea and diarrhea. Negative for vomiting.  Genitourinary: Negative for  dysuria and decreased urine volume.  Musculoskeletal: Positive for myalgias.  Skin: Negative for rash.  All other systems reviewed and are negative.    Allergies  Review of patient's allergies indicates no known allergies.  Home Medications   Current Outpatient Rx  Name  Route  Sig  Dispense  Refill  . acetaminophen (TYLENOL) 500 MG tablet   Oral   Take 500 mg by mouth every 6 (six) hours as needed. Pain         . guaiFENesin (MUCINEX) 600 MG 12 hr tablet   Oral   Take 600 mg by mouth 2 (two) times daily as needed (cold and cough).         . Pseudoeph-Doxylamine-DM-APAP (NYQUIL MULTI-SYMPTOM PO)   Oral   Take 1 capsule by mouth at bedtime as needed (cold and cough).          Triage Vitals: BP 107/75  Pulse 75  Temp(Src) 98.8 F (37.1 C) (Oral)  Resp 16  SpO2 100% Physical Exam  Vitals reviewed. Constitutional: She is oriented to person, place, and time. She appears well-developed and well-nourished.  HENT:  Head: Normocephalic.  Eyes: Pupils are equal, round, and reactive to light.  Neck: Normal range of motion.  Cardiovascular: Normal rate and regular rhythm.   Pulmonary/Chest: Effort normal and breath sounds normal.  Abdominal: Soft. Bowel sounds are normal. There is no tenderness.  Neurological: She is alert and  oriented to person, place, and time.  Skin: Skin is warm.    ED Course  Procedures (including critical care time) DIAGNOSTIC STUDIES: Oxygen Saturation is 100% on RA, normal by my interpretation.    COORDINATION OF CARE: 4:08 PM- Will order albuterol and a spacer. Patient informed of current plan of treatment and evaluation and agrees with plan.     Labs Review Labs Reviewed - No data to display Imaging Review No results found.  EKG Interpretation   None       MDM   1. Influenza   2. Bronchospasm     I personally performed the services described in this documentation, which was scribed in my presence. The recorded information  has been reviewed and is accurate. Patient states, that she started noticing she has wheezing.  Several months ago, with exertion.  She is a smoker.  Denies any history of, asthma as a child. At this time.  She has influenza.  That is exacerbating her, bronchospasm.  She's been given an inhaler that she has been instructed in its use with a spacer.  She is to use 2 puffs every 4-6 hours.  A regular basis for the next 2 days while awake and then as needed thereafter.  Have also encouraged her to stop smoking  Arman Filter, NP 03/29/13 1637  Arman Filter, NP 03/29/13 1638  Arman Filter, NP 04/12/13 [phone]

## 2013-03-29 NOTE — ED Notes (Signed)
Pt not in room to receive d/c instructions.  

## 2013-03-29 NOTE — Progress Notes (Signed)
[facility] provided pt with a list of primary care resources, [facility] [employer] Card application, and [facility] information.  °

## 2013-04-06 NOTE — ED Provider Notes (Signed)
Medical screening examination/treatment/procedure(s) were performed by non-physician practitioner and as supervising physician I was immediately available for consultation/collaboration.  EKG Interpretation   None        Rolland Steinert, MD 04/06/13 2156 

## 2013-04-14 NOTE — ED Provider Notes (Signed)
Medical screening examination/treatment/procedure(s) were performed by non-physician practitioner and as supervising physician I was immediately available for consultation/collaboration.  EKG Interpretation   None        Emelie Newsom, MD 04/14/13 0707 

## 2014-04-05 ENCOUNTER — Encounter (HOSPITAL_COMMUNITY): Payer: Self-pay | Admitting: *Deleted

## 2014-04-05 ENCOUNTER — Inpatient Hospital Stay (HOSPITAL_COMMUNITY): Payer: Self-pay

## 2014-04-05 ENCOUNTER — Inpatient Hospital Stay (HOSPITAL_COMMUNITY)
Admission: AD | Admit: 2014-04-05 | Discharge: 2014-04-05 | Disposition: A | Discharge status: Home / Self Care | Payer: Self-pay | Source: Ambulatory Visit | Attending: Obstetrics & Gynecology | Admitting: Obstetrics & Gynecology

## 2014-04-05 DIAGNOSIS — R102 Pelvic and perineal pain: Secondary | ICD-10-CM

## 2014-04-05 DIAGNOSIS — F17210 Nicotine dependence, cigarettes, uncomplicated: Secondary | ICD-10-CM | POA: Insufficient documentation

## 2014-04-05 DIAGNOSIS — A499 Bacterial infection, unspecified: Secondary | ICD-10-CM

## 2014-04-05 DIAGNOSIS — B9689 Other specified bacterial agents as the cause of diseases classified elsewhere: Secondary | ICD-10-CM | POA: Insufficient documentation

## 2014-04-05 DIAGNOSIS — N760 Acute vaginitis: Secondary | ICD-10-CM | POA: Insufficient documentation

## 2014-04-05 DIAGNOSIS — R300 Dysuria: Secondary | ICD-10-CM | POA: Insufficient documentation

## 2014-04-05 HISTORY — DX: Other specified health status: Z78.9

## 2014-04-05 LAB — URINALYSIS, ROUTINE W REFLEX MICROSCOPIC
Bilirubin Urine: NEGATIVE
GLUCOSE, UA: NEGATIVE mg/dL
Ketones, ur: NEGATIVE mg/dL
Nitrite: NEGATIVE
PROTEIN: NEGATIVE mg/dL
SPECIFIC GRAVITY, URINE: 1.025 (ref 1.005–1.030)
Urobilinogen, UA: 0.2 mg/dL (ref 0.0–1.0)
pH: 5 (ref 5.0–8.0)

## 2014-04-05 LAB — WET PREP, GENITAL
Trich, Wet Prep: NONE SEEN
YEAST WET PREP: NONE SEEN

## 2014-04-05 LAB — URINE MICROSCOPIC-ADD ON

## 2014-04-05 LAB — POCT PREGNANCY, URINE: PREG TEST UR: NEGATIVE

## 2014-04-05 MED ORDER — NITROFURANTOIN MONOHYD MACRO 100 MG PO CAPS
100.0000 mg | ORAL_CAPSULE | Freq: Two times a day (BID) | ORAL | Status: DC
Start: 2014-04-05 — End: 2016-07-31

## 2014-04-05 MED ORDER — METRONIDAZOLE 500 MG PO TABS: 500.0000 mg | ORAL_TABLET | Freq: Two times a day (BID) | ORAL | Status: DC

## 2014-04-05 NOTE — MAU Provider Note (Signed)
History     CSN: [id number]  Arrival date and time: 04/05/14 1411   First Provider Initiated Contact with Patient 04/05/14 1453      Chief Complaint  Patient presents with  . Dysuria  . Abdominal Cramping   HPI  Pt is a G2P2000 here with report of pelvic cramping and pain with urination for about 2 weeks. Also reports bilat lower back pain.  Denies fever, body aches, or chills.  Reports vaginal itching and spotting that started last week. +yellowish vaginal discharge.   Bleeding is irregular and light.    Past Medical History  Diagnosis Date  . Medical history non-contributory     Past Surgical History  Procedure Laterality Date  . Dilation and curettage of uterus  2005    History reviewed. No pertinent family history.  History  Substance Use Topics  . Smoking status: Current Every Day Smoker -- 1.00 packs/day for 10 years    Types: Cigarettes  . Smokeless tobacco: Not on file  . Alcohol Use: No    Allergies: No Known Allergies  Prescriptions prior to admission  Medication Sig Dispense Refill Last Dose  . acetaminophen (TYLENOL) 500 MG tablet Take 500 mg by mouth every 6 (six) hours as needed. Pain   03/29/2013 at Unknown time  . guaiFENesin (MUCINEX) 600 MG 12 hr tablet Take 600 mg by mouth 2 (two) times daily as needed (cold and cough).   03/28/2013 at Unknown time  . Pseudoeph-Doxylamine-DM-APAP (NYQUIL MULTI-SYMPTOM PO) Take 1 capsule by mouth at bedtime as needed (cold and cough).   03/28/2013 at Unknown time    Review of Systems  Constitutional: Negative for fever and chills.  Gastrointestinal: Positive for abdominal pain. Negative for nausea and vomiting.  Genitourinary: Positive for dysuria and flank pain. Negative for urgency, frequency and hematuria.       Vaginal itching  Musculoskeletal: Positive for back pain (bilater lower back pain).   Physical Exam   Blood pressure 113/68, pulse 66, temperature 98.6 F (37 C), temperature source Oral, resp.  rate 16, height 5\' 4"  (1.626 m), weight 55.702 kg (122 lb 12.8 oz), last menstrual period 03/29/2014, SpO2 98 %.  Physical Exam  Constitutional: She is oriented to person, place, and time. She appears well-developed and well-nourished.  HENT:  Head: Normocephalic.  Eyes: Pupils are equal, round, and reactive to light.  Neck: Normal range of motion. Neck supple.  Cardiovascular: Normal rate, regular rhythm and normal heart sounds.   Respiratory: Effort normal and breath sounds normal.  GI: Soft. There is tenderness (suprapubic).  Genitourinary: Vaginal discharge (white, creamy) found.  Negative cervical motion tenderness; IUD strings present  Musculoskeletal: Normal range of motion.       Arms: Neurological: She is alert and oriented to person, place, and time. She has normal reflexes.  Skin: Skin is warm and dry.    MAU Course  Procedures  Results for orders placed or performed during the hospital encounter of 04/05/14 (from the past 24 hour(s))  Urinalysis, Routine w reflex microscopic     Status: Abnormal   Collection Time: 04/05/14  2:24 PM  Result Value Ref Range   Color, Urine YELLOW YELLOW   APPearance HAZY (A) CLEAR   Specific Gravity, Urine 1.025 1.005 - 1.030   pH 5.0 5.0 - 8.0   Glucose, UA NEGATIVE NEGATIVE mg/dL   Hgb urine dipstick TRACE (A) NEGATIVE   Bilirubin Urine NEGATIVE NEGATIVE   Ketones, ur NEGATIVE NEGATIVE mg/dL   Protein, ur NEGATIVE  NEGATIVE mg/dL   Urobilinogen, UA 0.2 0.0 - 1.0 mg/dL   Nitrite NEGATIVE NEGATIVE   Leukocytes, UA MODERATE (A) NEGATIVE  Urine microscopic-add on     Status: Abnormal   Collection Time: 04/05/14  2:24 PM  Result Value Ref Range   Squamous Epithelial / LPF MANY (A) RARE   WBC, UA 7-10 <3 WBC/hpf   Bacteria, UA FEW (A) RARE  Pregnancy, urine POC     Status: None   Collection Time: 04/05/14  2:29 PM  Result Value Ref Range   Preg Test, Ur NEGATIVE NEGATIVE  Wet prep, genital     Status: Abnormal   Collection Time:  04/05/14  3:28 PM  Result Value Ref Range   Yeast Wet Prep HPF POC NONE SEEN NONE SEEN   Trich, Wet Prep NONE SEEN NONE SEEN   Clue Cells Wet Prep HPF POC FEW (A) NONE SEEN   WBC, Wet Prep HPF POC MODERATE (A) NONE SEEN   Ultrasound: Endometrium  Thickness: 2 mm. No focal abnormality identified. Intrauterine device appears in appropriate position.  Right ovary  Measurements: 2.2 x 1.4 x 2.8 cm. Normal appearance/no adnexal mass.  Left ovary  Measurements: 3.1 x 1.6 x 3.2 cm. Normal appearance/no adnexal mass.  Other findings  Trace free fluid.  IMPRESSION: Intrauterine device appears in appropriate position.  Trace free fluid in the pelvis.  Assessment and Plan  Bacterial Vaginosis Dysuria  Plan: Discharge to home RX Flagyl 500 mg po BID x 7 days Macrobid 100 mg po BID x 5 days. Urine culture to lab GC/CT pending  Marlis EdelsonKARIM, WALIDAH N 04/05/2014, 2:55 PM

## 2014-04-05 NOTE — MAU Note (Signed)
Patient states she has had abdominal cramping and pain with urination for about 2 weeks. Denies bleeding, has an IUD.

## 2014-04-06 LAB — GC/CHLAMYDIA PROBE AMP
CT PROBE, AMP APTIMA: NEGATIVE
GC PROBE AMP APTIMA: NEGATIVE

## 2014-04-06 LAB — URINE CULTURE
COLONY COUNT: NO GROWTH
CULTURE: NO GROWTH

## 2014-04-06 LAB — HIV ANTIBODY (ROUTINE TESTING W REFLEX): HIV: NONREACTIVE

## 2015-05-12 ENCOUNTER — Emergency Department (HOSPITAL_COMMUNITY): Payer: Self-pay

## 2015-05-12 ENCOUNTER — Emergency Department (HOSPITAL_COMMUNITY)
Admission: EM | Admit: 2015-05-12 | Discharge: 2015-05-12 | Disposition: A | Discharge status: Home / Self Care | Payer: Self-pay | Attending: Emergency Medicine | Admitting: Emergency Medicine

## 2015-05-12 ENCOUNTER — Encounter (HOSPITAL_COMMUNITY): Payer: Self-pay

## 2015-05-12 DIAGNOSIS — Z79899 Other long term (current) drug therapy: Secondary | ICD-10-CM | POA: Insufficient documentation

## 2015-05-12 DIAGNOSIS — M25552 Pain in left hip: Secondary | ICD-10-CM | POA: Insufficient documentation

## 2015-05-12 DIAGNOSIS — F17210 Nicotine dependence, cigarettes, uncomplicated: Secondary | ICD-10-CM | POA: Insufficient documentation

## 2015-05-12 NOTE — Discharge Instructions (Signed)
Hip Bursitis Bursitis is a swelling and soreness (inflammation) of a fluid-filled sac (bursa). This sac overlies and protects the joints.  CAUSES   Injury.  Overuse of the muscles surrounding the joint.  Arthritis.  Gout.  Infection.  Cold weather.  Inadequate warm-up and conditioning prior to activities. The cause may not be known.  SYMPTOMS   Mild to severe irritation.  Tenderness and swelling over the outside of the hip.  Pain with motion of the hip.  If the bursa becomes infected, a fever may be present. Redness, tenderness, and warmth will develop over the hip. Symptoms usually lessen in 3 to 4 weeks with treatment, but can come back. TREATMENT If conservative treatment does not work, your caregiver may advise draining the bursa and injecting cortisone into the area. This may speed up the healing process. This may also be used as an initial treatment of choice. HOME CARE INSTRUCTIONS   Apply ice to the affected area for 15-20 minutes every 3 to 4 hours while awake for the first 2 days. Put the ice in a plastic bag and place a towel between the bag of ice and your skin.  Rest the painful joint as much as possible, but continue to put the joint through a normal range of motion at least 4 times per day. When the pain lessens, begin normal, slow movements and usual activities to help prevent stiffness of the hip.  Only take over-the-counter or prescription medicines for pain, discomfort, or fever as directed by your caregiver.  Use crutches to limit weight bearing on the hip joint, if advised.  Elevate your painful hip to reduce swelling. Use pillows for propping and cushioning your legs and hips.  Gentle massage may provide comfort and decrease swelling. SEEK IMMEDIATE MEDICAL CARE IF:   Your pain increases even during treatment, or you are not improving.  You have a fever.  You have heat and inflammation over the involved bursa.  You have any other questions or  concerns. MAKE SURE YOU:   Understand these instructions.  Will watch your condition.  Will get help right away if you are not doing well or get worse.   This information is not intended to replace advice given to you by your health care provider. Make sure you discuss any questions you have with your health care provider.   Document Released: 09/14/2001 Document Revised: 06/17/2011 Document Reviewed: 10/25/2014 Elsevier Interactive Patient Education 2016 [employer]  

## 2015-05-12 NOTE — ED Notes (Signed)
Pt presents with 1 week h/o L hip pain.  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  Pt denies any swelling to area.  Pt reports pain began to L hip and now radiates into L lower back and down L leg.

## 2015-05-12 NOTE — ED Provider Notes (Signed)
CSN: [id number]     Arrival date & time 05/12/15  1158 History  By signing my name below, I, Marica Otter, attest that this documentation has been prepared under the direction and in the presence of Cheri Fowler, PA-C. Electronically Signed: Marica Otter, ED Scribe. 05/12/2015. 12:27 PM.  Chief Complaint  Patient presents with  . Leg Pain  . Hip Pain   The history is provided by the patient. No language interpreter was used.   PCP: No primary care provider on file. HPI Comments: Kristine [name] is a 30 y.o. female, with PMHx noted below, who presents to the Emergency Department complaining of atraumatic, acute, sudden onset, intermittent, worsening, moderate radiating left hip pain radiating to left lower back and down the left leg onset 6 days ago. 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 Pt denies any swelling to the affected area. Pt reports taking ibuprofen at home with moderate relief. Associated Sx include weakness of RLE secondary to pain. Pt denies fever, chills, bowel or urinary incontinence, dysuria, abd pain. Pt denies any Hx of cancer. Pt further denies any IV drug use.   Past Medical History  Diagnosis Date  . Medical history non-contributory    Past Surgical History  Procedure Laterality Date  . Dilation and curettage of uterus  2005   History reviewed. No pertinent family history. Social History  Substance Use Topics  . Smoking status: Current Every Day Smoker -- 1.00 packs/day for 10 years    Types: Cigarettes  . Smokeless tobacco: None  . Alcohol Use: No   OB History    Gravida Para Term Preterm AB TAB SAB Ectopic Multiple Living   Review of Systems  Constitutional: Negative for fever and chills.  Genitourinary: Negative for dysuria.  Musculoskeletal: Positive for back pain and arthralgias  (radiating left hip pain, left leg pain).  Neurological: Positive for weakness (secondary to pain).  All other systems reviewed and are negative.  Allergies  Review of patient's allergies indicates no known allergies.  Home Medications   Prior to Admission medications   Medication Sig Start Date End Date Taking? Authorizing Provider  acetaminophen (TYLENOL) 500 MG tablet Take 500 mg by mouth every 6 (six) hours as needed. Pain    Historical Provider, MD  metroNIDAZOLE (FLAGYL) 500 MG tablet Take 1 tablet (500 mg total) by mouth 2 (two) times daily. 04/05/14   Marlis Edelson, CNM  nitrofurantoin, macrocrystal-monohydrate, (MACROBID) 100 MG capsule Take 1 capsule (100 mg total) by mouth 2 (two) times daily. 04/05/14   Marlis Edelson, CNM   Triage Vitals: BP 123/74 mmHg  Pulse 65  Temp(Src) 98.5 F (36.9 C) (Oral)  Resp 18  Ht  (1.626 m)  Wt 125 lb (56.7 kg)  BMI 21.45 kg/m2  SpO2 96%  LMP 04/21/2015 (Approximate) Physical Exam  Constitutional: She is oriented to person, place, and time. She appears well-developed and well-nourished.  HENT:  Head: Normocephalic and atraumatic.  Right Ear: External ear normal.  Left Ear: External ear normal.  Eyes: Conjunctivae are normal. No scleral icterus.  Neck: No tracheal deviation present.  Cardiovascular: Intact distal pulses.   Pulses:      Posterior tibial pulses are 2+ on the right  side, and 2+ on the left side.  Pulmonary/Chest: Effort normal. No respiratory distress.  Abdominal: She exhibits no distension.  Musculoskeletal: Normal range of motion. She exhibits tenderness. She exhibits no edema.  Left hip:  No swelling, obvious deformity, ecchymosis, erythema, or warmth.  Mildly TTP over greater trochanter. FAROM of left hip with mild pain. No midline lumbar tenderness.  Neurological: She is alert and oriented to person, place, and time.  Strength and sensation intact bilaterally throughout lower extremities.  Gait normal.    Skin: Skin is warm and dry.  Psychiatric: She has a normal mood and affect. Her behavior is normal.    ED Course  Procedures (including critical care time) DIAGNOSTIC STUDIES: Oxygen Saturation is 96% on ra, nl by my interpretation.    COORDINATION OF CARE: 12:24 PM: Discussed treatment plan which includes imaging and antiinflammatories with pt at bedside; patient verbalizes understanding and agrees with treatment plan.  Imaging Review Dg Hip Unilat With Pelvis 2-3 Views Left  05/12/2015  CLINICAL DATA:  Injury.  Pain. EXAM: DG HIP (WITH OR WITHOUT PELVIS) 2-3V LEFT COMPARISON:  CT 11/13/2012. FINDINGS: No acute bony or joint abnormality identified. IUD noted. Pelvic calcifications noted consistent phleboliths. IMPRESSION: No acute or focal abnormality identified.  IUD in the pelvis. Electronically Signed   By: Maisie Fus  Register   On: 05/12/2015 13:23   I have personally reviewed and evaluated these images as part of my medical decision-making.  MDM   Final diagnoses:  Left hip pain   Patient X-Ray negative for obvious fracture or dislocation.  Pt advised to follow up with orthopedics if symptoms persist.  Conservative therapy with OTC aleve recommended and discussed. Patient will be dc home & is agreeable with above plan.  I personally performed the services described in this documentation, which was scribed in my presence. The recorded information has been reviewed and is accurate.      Cheri Fowler, PA-C 05/12/15 1345  Marily Memos, MD 05/13/15 1059

## 2015-05-12 NOTE — ED Notes (Signed)
See PA assessment. One touch patient. 

## 2016-07-31 ENCOUNTER — Ambulatory Visit (HOSPITAL_COMMUNITY)
Admission: EM | Admit: 2016-07-31 | Discharge: 2016-07-31 | Disposition: A | Discharge status: Home / Self Care | Payer: Self-pay | Attending: Family Medicine | Admitting: Family Medicine

## 2016-07-31 ENCOUNTER — Encounter (HOSPITAL_COMMUNITY): Payer: Self-pay | Admitting: Emergency Medicine

## 2016-07-31 DIAGNOSIS — J40 Bronchitis, not specified as acute or chronic: Secondary | ICD-10-CM

## 2016-07-31 MED ORDER — VARENICLINE TARTRATE 0.5 MG PO TABS: 0.5000 mg | ORAL_TABLET | Freq: Two times a day (BID) | ORAL | 3 refills | Status: DC

## 2016-07-31 MED ORDER — AMOXICILLIN 875 MG PO TABS: 875.0000 mg | ORAL_TABLET | Freq: Two times a day (BID) | ORAL | 0 refills | Status: DC

## 2016-07-31 MED ORDER — PREDNISONE 20 MG PO TABS: ORAL_TABLET | ORAL | 0 refills | Status: DC

## 2016-07-31 NOTE — Discharge Instructions (Signed)
You should be much better in 2 days.  Go to [facility] for a lower price on the medications.

## 2016-07-31 NOTE — ED Triage Notes (Signed)
Pt has had a cold since January.  States she has been getting progressively worse, with SOB, wheezing, congestion, and blood in her phlegm.  She was seen by her PCP and they sent her here to have an xray for possible PNA.

## 2016-07-31 NOTE — ED Provider Notes (Signed)
[facility]    CSN: [id number] Arrival date & time: 07/31/16  1104     History   Chief Complaint Chief Complaint  Patient presents with  . chest congestion    HPI Kristine [name] is a 31 y.o. female.   Congestion   Patient was seen by her private doctor this morning. That doctor referred patient here for an x-ray and told her to request the results be sent back to the original doctor and she would be then evaluated and context with the x-ray done here.  Patient opted to stay here and be treated here.  Patient is a smoker. She would like a refill on Chantix. She works as a Child psychotherapist. She's had some blood-tinged mucus from her nose and from the back of her mouth but she is not short of breath. She's been wheezing. She denies chest pain.  She has had upper respiratory symptoms with cough and congestion since January.  Patient is taken Chantix before. She borrowed her boyfriends medicine and it worked very well for her. It did make her nauseated and she has some mild depression but, but she thinks that depression was more a loss of the cigarette habit. Within several days of taking Chantix in the past, she was able to cut down from one pack a day to 4 cigarettes a day.      Past Medical History:  Diagnosis Date  . Medical history non-contributory     There are no active problems to display for this patient.   Past Surgical History:  Procedure Laterality Date  . DILATION AND CURETTAGE OF UTERUS  2005    OB History    Gravida Para Term Preterm AB Living   SAB TAB Ectopic Multiple Live Births                   Home Medications    Prior to Admission medications   Medication Sig Start Date End Date Taking? Authorizing Provider  acetaminophen (TYLENOL) 500 MG tablet Take 500 mg by mouth every 6 (six) hours as needed. Pain    Historical Provider, MD  amoxicillin (AMOXIL) 875 MG tablet Take 1 tablet (875 mg total) by mouth 2 (two) times daily.  07/31/16   Elvina Sidle, MD  predniSONE (DELTASONE) 20 MG tablet Two daily with food 07/31/16   Elvina Sidle, MD  varenicline (CHANTIX) 0.5 MG tablet Take 1 tablet (0.5 mg total) by mouth 2 (two) times daily. 07/31/16   Elvina Sidle, MD    Family History History reviewed. No pertinent family history.  Social History Social History  Substance Use Topics  . Smoking status: Current Every Day Smoker    Packs/day: 1.00    Years: 10.00    Types: Cigarettes  . Smokeless tobacco: Never Used  . Alcohol use No     Allergies   Patient has no known allergies.   Review of Systems Review of Systems  HENT: Positive for congestion.   Respiratory: Positive for cough and wheezing.   All other systems reviewed and are negative.    Physical Exam Triage Vital Signs ED Triage Vitals  Enc Vitals Group     BP      Pulse      Resp      Temp      Temp src      SpO2      Weight      Height  Head Circumference      Peak Flow      Pain Score      Pain Loc      Pain Edu?      Excl. in GC?    No data found.   Updated Vital Signs BP 129/74 (BP Location: Left Arm)   Pulse 67   Temp 98.4 F (36.9 C) (Oral)   SpO2 98%    Physical Exam  Constitutional: She is oriented to person, place, and time. She appears well-developed and well-nourished.  HENT:  Right Ear: External ear normal.  Left Ear: External ear normal.  Mouth/Throat: Oropharynx is clear and moist.  Eyes: Conjunctivae and EOM are normal. Pupils are equal, round, and reactive to light.  Neck: Normal range of motion. Neck supple.  Cardiovascular: Normal rate, regular rhythm and normal heart sounds.   Pulmonary/Chest: Effort normal. She has wheezes.  Musculoskeletal: Normal range of motion.  Neurological: She is alert and oriented to person, place, and time.  Skin: Skin is warm and dry.  Nursing note and vitals reviewed.    UC Treatments / Results  Labs (all labs ordered are listed, but only abnormal  results are displayed) Labs Reviewed - No data to display  EKG  EKG Interpretation None       Radiology No results found.  Procedures Procedures (including critical care time)  Medications Ordered in UC Medications - No data to display   Initial Impression / Assessment and Plan / UC Course  I have reviewed the triage vital signs and the nursing notes.  Pertinent labs & imaging results that were available during my care of the patient were reviewed by me and considered in my medical decision making (see chart for details).     Final Clinical Impressions(s) / UC Diagnoses   Final diagnoses:  Bronchitis    New Prescriptions New Prescriptions   AMOXICILLIN (AMOXIL) 875 MG TABLET    Take 1 tablet (875 mg total) by mouth 2 (two) times daily.   PREDNISONE (DELTASONE) 20 MG TABLET    Two daily with food   VARENICLINE (CHANTIX) 0.5 MG TABLET    Take 1 tablet (0.5 mg total) by mouth 2 (two) times daily.     Elvina Sidle, MD 07/31/16 1233

## 2020-12-20 ENCOUNTER — Other Ambulatory Visit: Payer: Self-pay | Admitting: Nurse Practitioner

## 2020-12-20 DIAGNOSIS — N644 Mastodynia: Secondary | ICD-10-CM

## 2021-01-03 ENCOUNTER — Other Ambulatory Visit: Payer: Self-pay

## 2021-01-03 ENCOUNTER — Other Ambulatory Visit: Payer: Self-pay | Admitting: Nurse Practitioner

## 2021-01-03 ENCOUNTER — Ambulatory Visit
Admission: RE | Admit: 2021-01-03 | Discharge: 2021-01-03 | Disposition: A | Discharge status: Home / Self Care | Payer: Self-pay | Source: Ambulatory Visit | Attending: Nurse Practitioner | Admitting: Nurse Practitioner

## 2021-01-03 ENCOUNTER — Ambulatory Visit
Admission: RE | Admit: 2021-01-03 | Discharge: 2021-01-03 | Disposition: A | Discharge status: Home / Self Care | Payer: 59 | Source: Ambulatory Visit | Attending: Nurse Practitioner | Admitting: Nurse Practitioner

## 2021-01-03 DIAGNOSIS — N6489 Other specified disorders of breast: Secondary | ICD-10-CM

## 2021-01-03 DIAGNOSIS — N644 Mastodynia: Secondary | ICD-10-CM

## 2021-01-11 ENCOUNTER — Other Ambulatory Visit: Payer: Self-pay

## 2021-07-23 ENCOUNTER — Other Ambulatory Visit: Payer: Self-pay | Admitting: Nurse Practitioner

## 2021-07-23 ENCOUNTER — Ambulatory Visit
Admission: RE | Admit: 2021-07-23 | Discharge: 2021-07-23 | Disposition: A | Discharge status: Home / Self Care | Payer: 59 | Source: Ambulatory Visit | Attending: Nurse Practitioner | Admitting: Nurse Practitioner

## 2021-07-23 DIAGNOSIS — N6489 Other specified disorders of breast: Secondary | ICD-10-CM

## 2021-07-23 DIAGNOSIS — N644 Mastodynia: Secondary | ICD-10-CM

## 2022-02-20 IMAGING — US US BREAST*L* LIMITED INC AXILLA
1 series · 13 of 24 positions shown · non-contrast
Comparison: None.

CLINICAL DATA: 35-year-old female with intermittent focal
tenderness and associated lumpiness of the lateral left breast for 1
month.



[Series 1: us breast*left* limited inc axilla · 0.06mm/px · 13 of 24 slices shown]
[im 1/24]
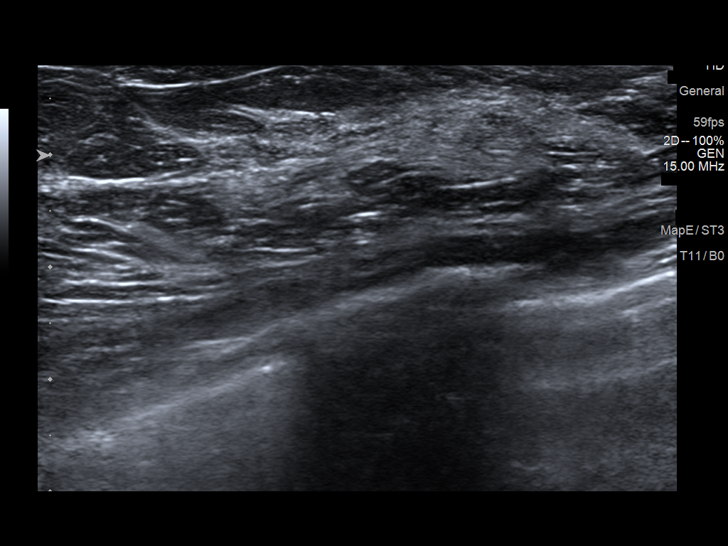
[im 3/24]
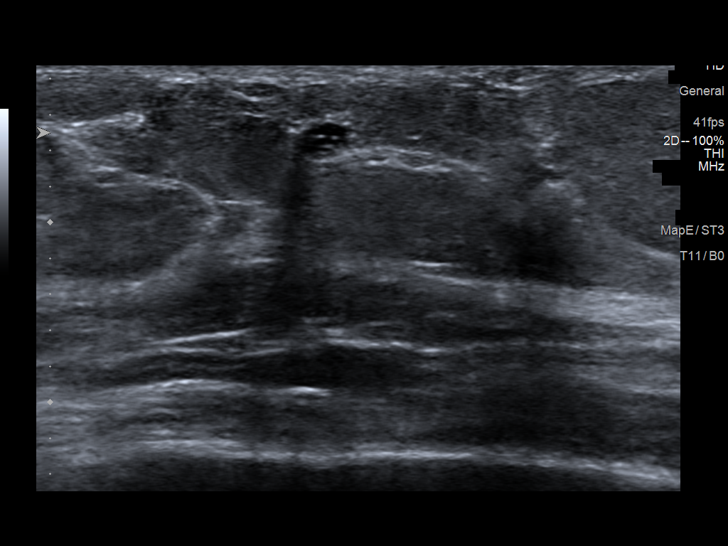
[im 5/24]
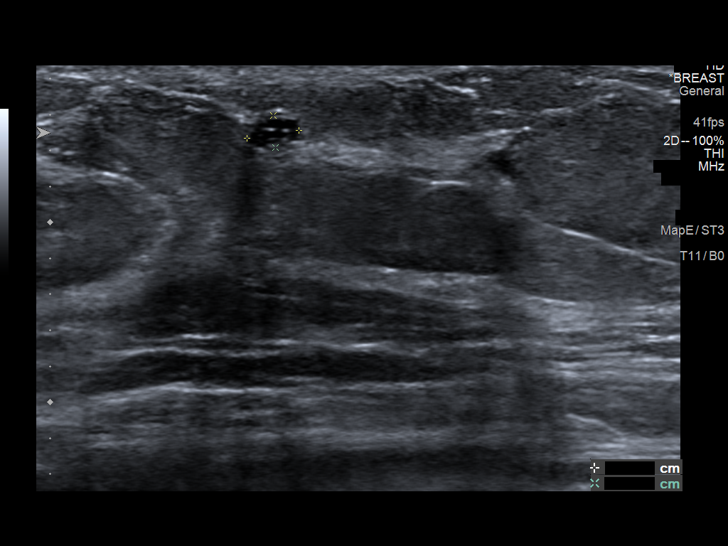
[im 7/24]
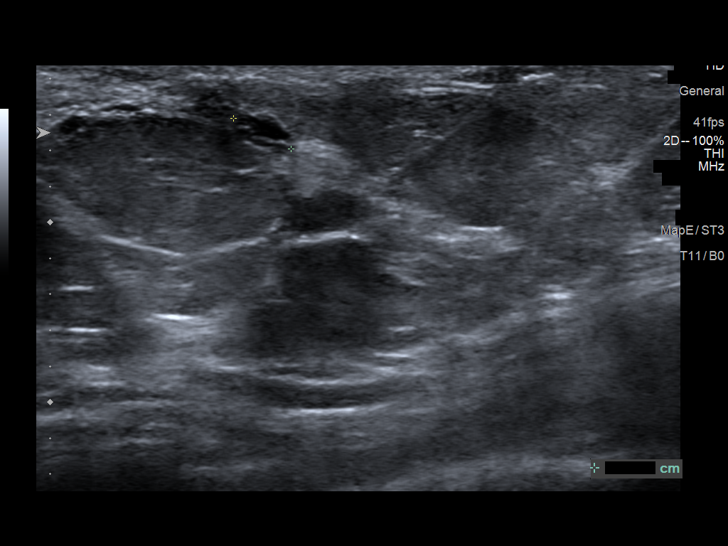
[im 9/24]
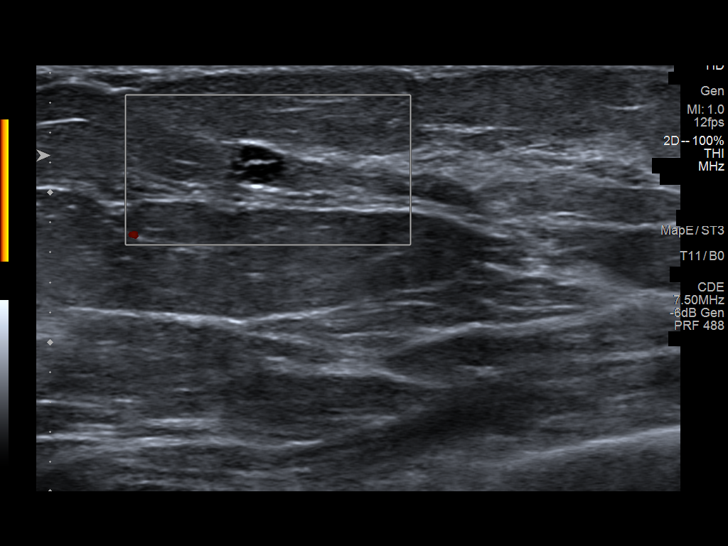
[im 11/24]
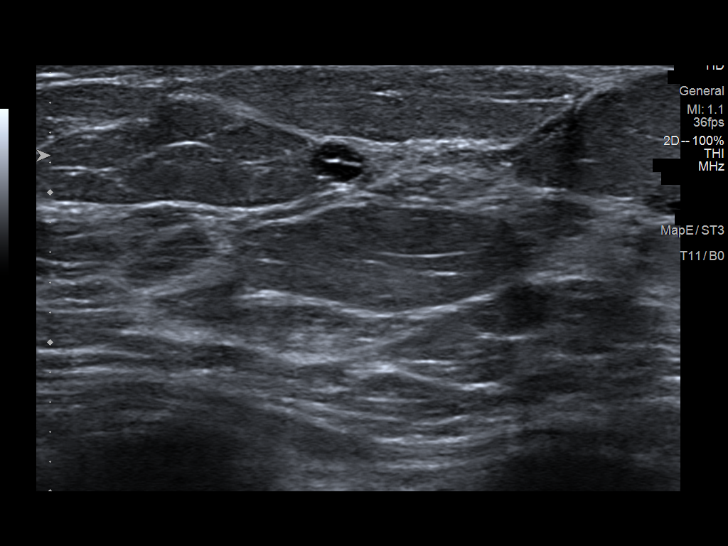
[im 13/24]
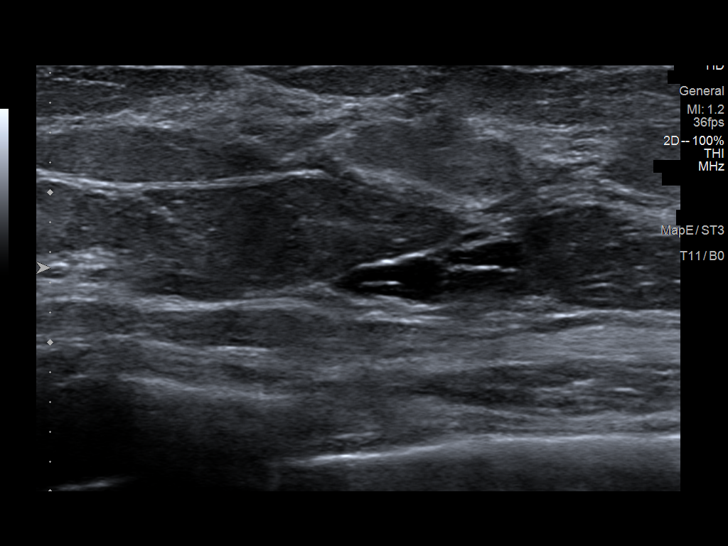
[im 14/24]
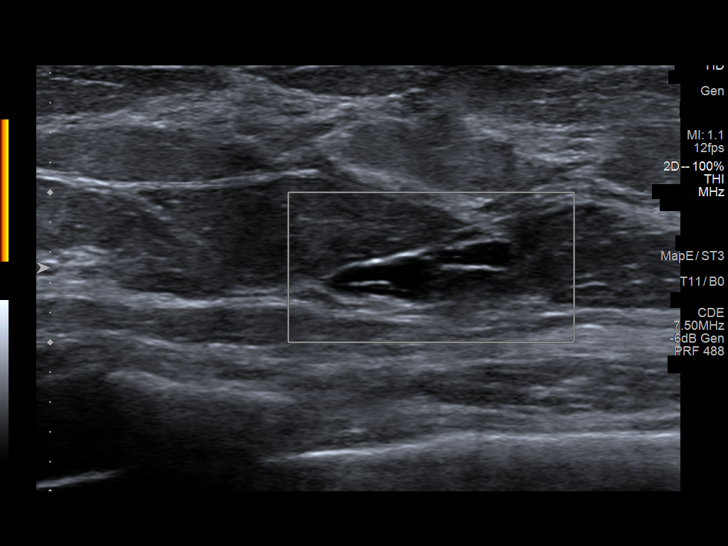
[im 16/24]
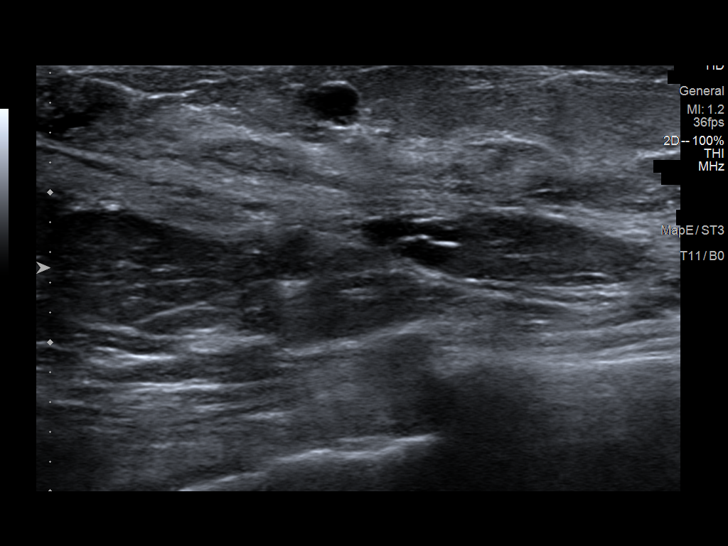
[im 18/24]
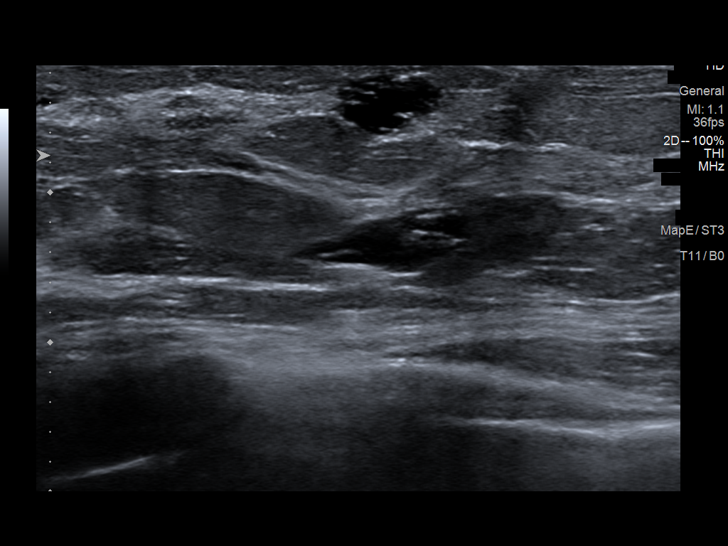
[im 20/24]
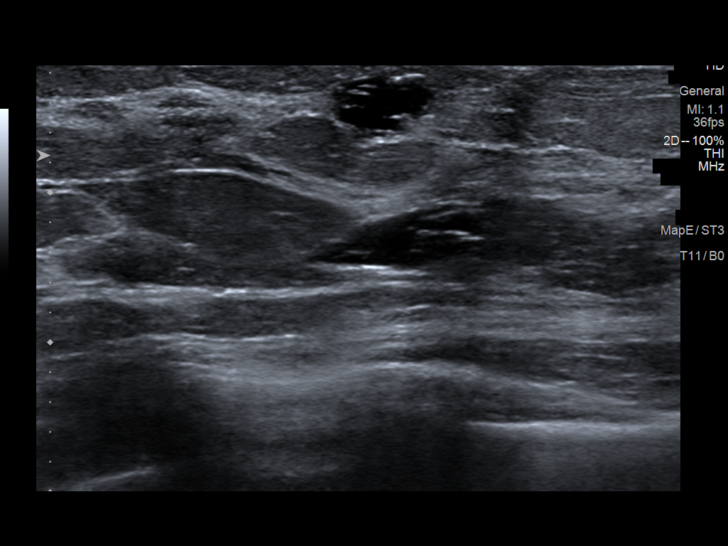
[im 22/24]
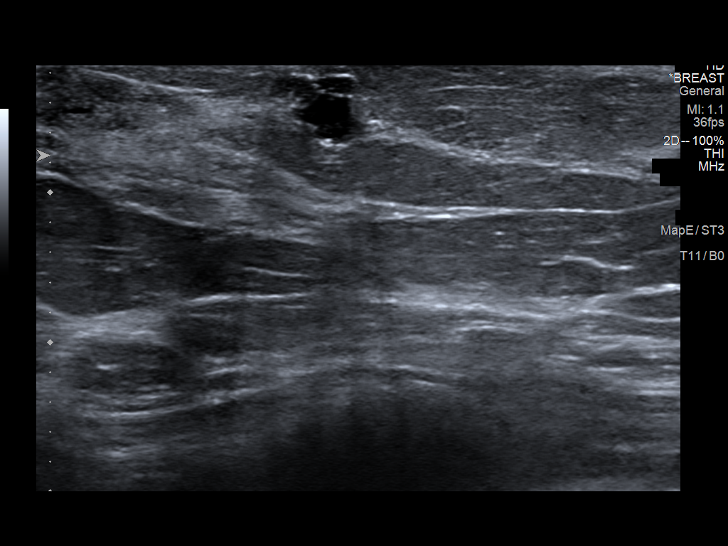
[im 24/24]
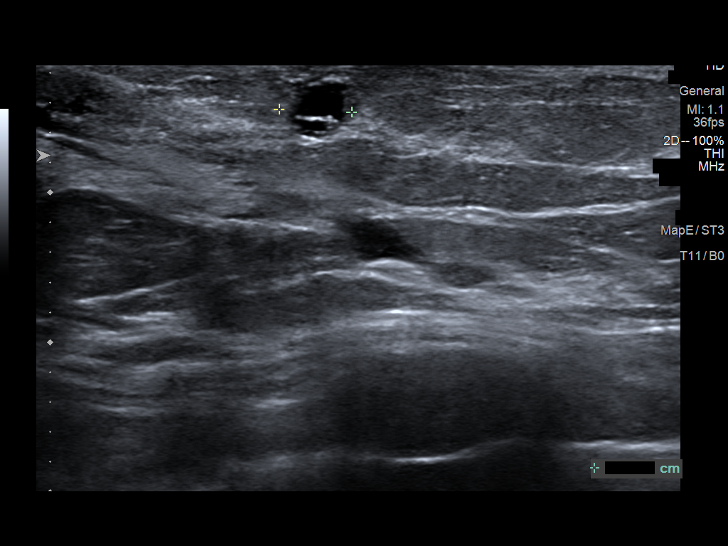

[13 of 24 positions shown; findings below may reference images not displayed]

ACR Breast Density Category b: There are scattered areas of
fibroglandular density.
FINDINGS: A radiopaque BB was placed at the site of the patient's symptoms in
the upper outer left breast at mid depth. No focal or suspicious
findings are seen deep to the radiopaque BB. There is an asymmetry
in the far posterior, slightly upper left breast is seen on the MLO
projection only. Additionally, oval, circumscribed equal density
masses are noted in the central and subareolar aspect. There is a
focal asymmetry most suggestive of an island of glandular tissue in
the deep upper outer right breast at posterior depth. No additional
suspicious findings in the remainder of either breast.

Targeted ultrasound is performed, showing multiple oval,
circumscribed multi-cystic masses throughout the left breast
representing cyst clusters. This includes a 3 x 2 x 4 mm cluster of
cysts at the 6 o'clock subareolar position, a 4 x 3 x 4 mm cluster
at the 12 o'clock retroareolar position, a 1.3 x 0.3 x 0.7 cm
cluster at the 1 o'clock retroareolar position and a 7 x 5 x 5 mm
cluster at the 1 o'clock position 1 cm from the nipple. These
correspond with the mammographic findings on the left. Evaluation of
the deep upper outer left breast demonstrates no suspicious
findings. Evaluation of the deep upper outer right breast
demonstrates no suspicious findings.
IMPRESSION: 1. Multiple benign cysts and cyst clusters scattered throughout the
left breast. No further imaging follow-up required.
2. Bilateral asymmetries without ultrasound correlates, likely
representing focal islands of fibroglandular tissue in the upper
outer quadrants. Recommendation is for short-term mammographic
follow-up.
3. No mammographic or sonographic findings corresponding with the
patient's left breast focal tenderness. Recommendation is for
clinical and symptomatic follow-up.

RECOMMENDATION:
Bilateral diagnostic mammogram in 6 months.

I have discussed the findings and recommendations with the patient.
If applicable, a reminder letter will be sent to the patient
regarding the next appointment.

BI-RADS CATEGORY  3: Probably benign.

## 2022-02-20 IMAGING — MG DIGITAL DIAGNOSTIC BILAT W/ TOMO W/ CAD
8 of 15 series · 8 of 40 positions shown · non-contrast
Comparison: None.

CLINICAL DATA: 35-year-old female with intermittent focal
tenderness and associated lumpiness of the lateral left breast for 1
month.



[R CC synth-2D]
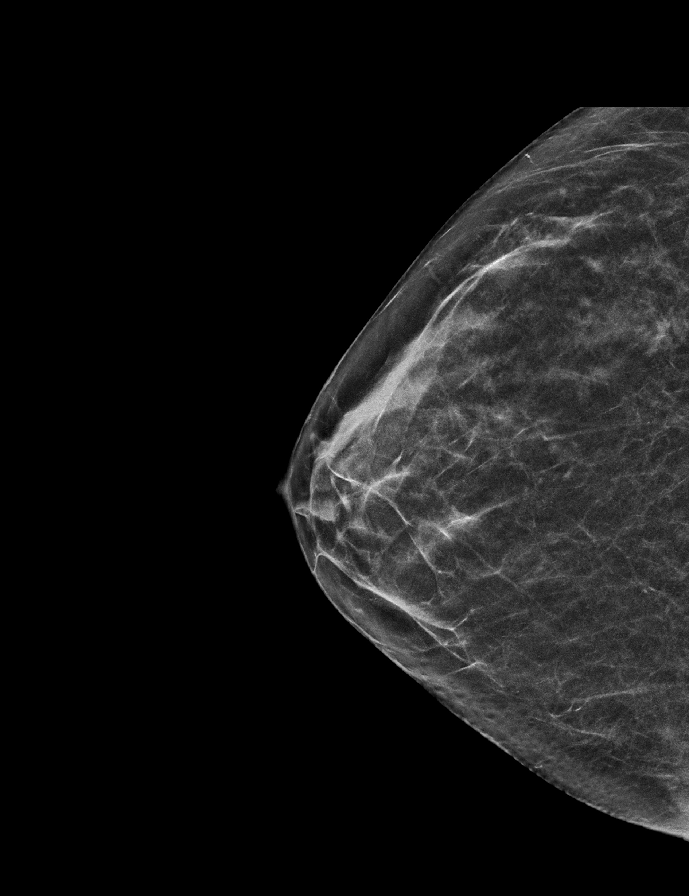

[L MLO synth-2D (1 of 3)]
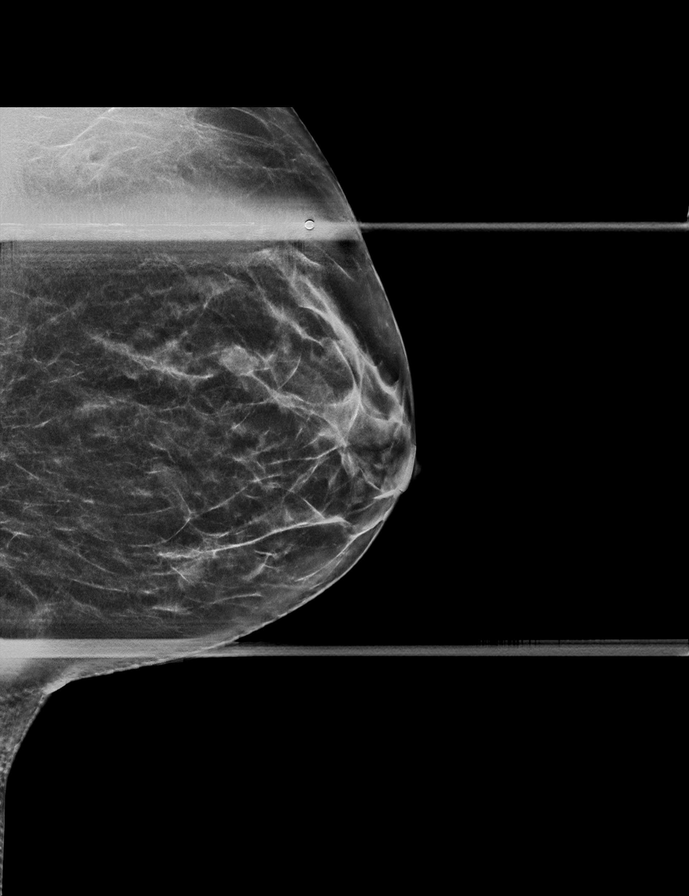

[L CC synth-2D (1 of 2)]
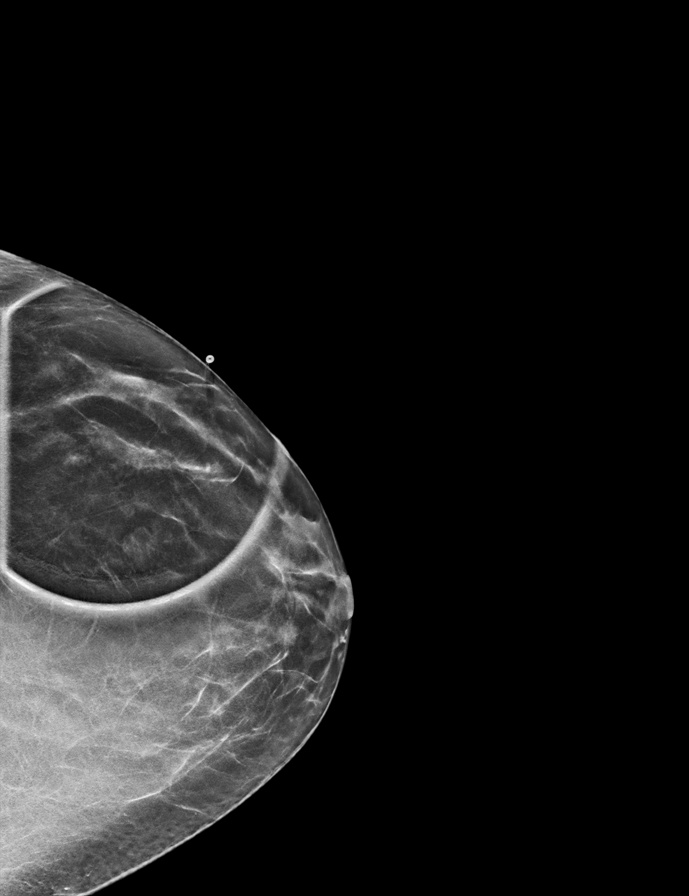

[R MLO synth-2D]
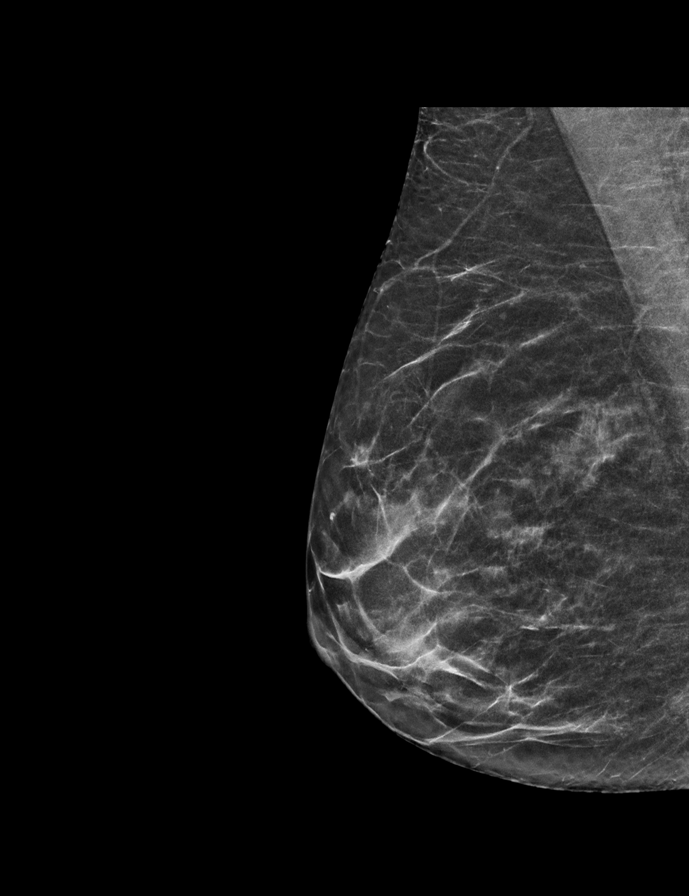

[L MLO synth-2D (2 of 3)]
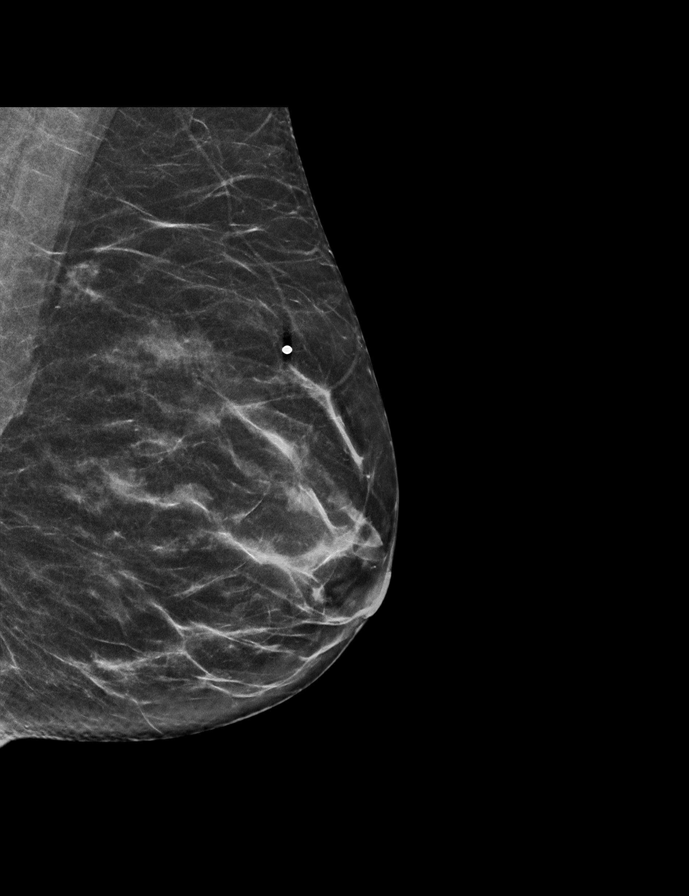

[L MLO synth-2D (3 of 3)]
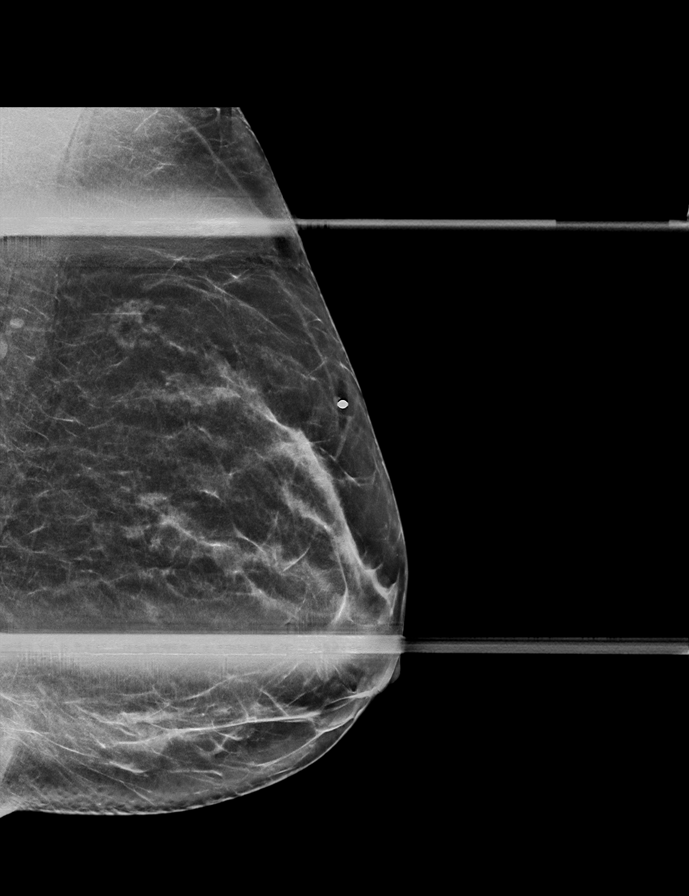

[L CC synth-2D (2 of 2)]
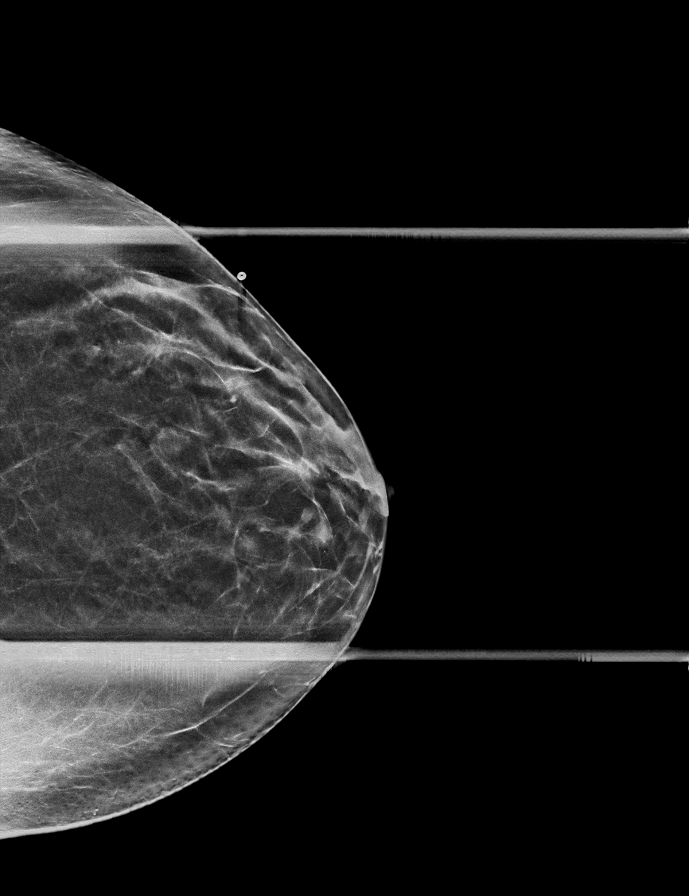

[L CC tomo · tomo slice 38/55.0]
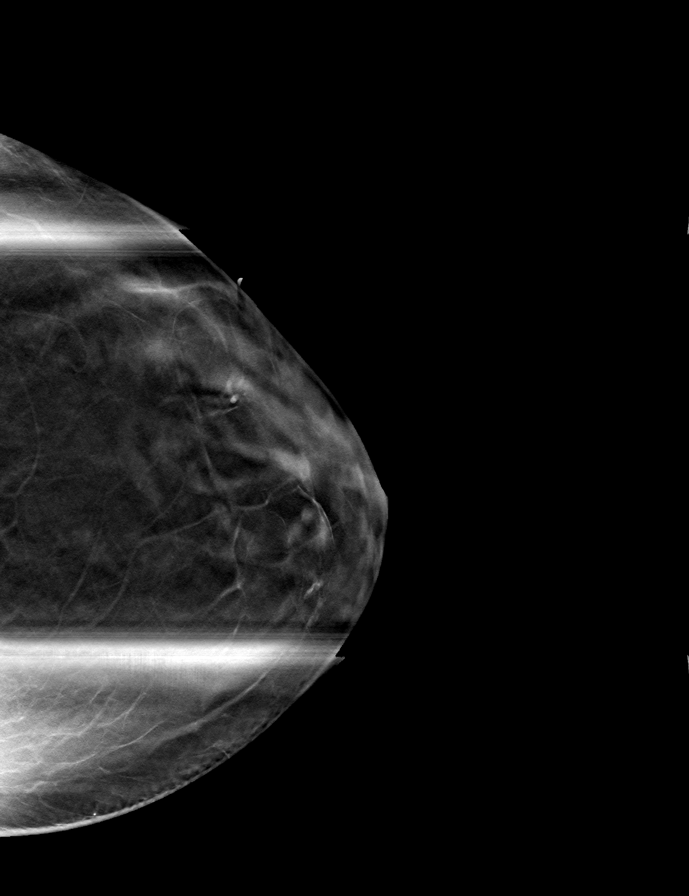

[8 of 40 positions shown; findings below may reference images not displayed]

ACR Breast Density Category b: There are scattered areas of
fibroglandular density.
FINDINGS: A radiopaque BB was placed at the site of the patient's symptoms in
the upper outer left breast at mid depth. No focal or suspicious
findings are seen deep to the radiopaque BB. There is an asymmetry
in the far posterior, slightly upper left breast is seen on the MLO
projection only. Additionally, oval, circumscribed equal density
masses are noted in the central and subareolar aspect. There is a
focal asymmetry most suggestive of an island of glandular tissue in
the deep upper outer right breast at posterior depth. No additional
suspicious findings in the remainder of either breast.

Targeted ultrasound is performed, showing multiple oval,
circumscribed multi-cystic masses throughout the left breast
representing cyst clusters. This includes a 3 x 2 x 4 mm cluster of
cysts at the 6 o'clock subareolar position, a 4 x 3 x 4 mm cluster
at the 12 o'clock retroareolar position, a 1.3 x 0.3 x 0.7 cm
cluster at the 1 o'clock retroareolar position and a 7 x 5 x 5 mm
cluster at the 1 o'clock position 1 cm from the nipple. These
correspond with the mammographic findings on the left. Evaluation of
the deep upper outer left breast demonstrates no suspicious
findings. Evaluation of the deep upper outer right breast
demonstrates no suspicious findings.
IMPRESSION: 1. Multiple benign cysts and cyst clusters scattered throughout the
left breast. No further imaging follow-up required.
2. Bilateral asymmetries without ultrasound correlates, likely
representing focal islands of fibroglandular tissue in the upper
outer quadrants. Recommendation is for short-term mammographic
follow-up.
3. No mammographic or sonographic findings corresponding with the
patient's left breast focal tenderness. Recommendation is for
clinical and symptomatic follow-up.

RECOMMENDATION:
Bilateral diagnostic mammogram in 6 months.

I have discussed the findings and recommendations with the patient.
If applicable, a reminder letter will be sent to the patient
regarding the next appointment.

BI-RADS CATEGORY  3: Probably benign.

## 2022-05-01 ENCOUNTER — Ambulatory Visit
Admission: RE | Admit: 2022-05-01 | Discharge: 2022-05-01 | Disposition: A | Discharge status: Home / Self Care | Payer: BLUE CROSS/BLUE SHIELD | Source: Ambulatory Visit | Attending: Nurse Practitioner | Admitting: Nurse Practitioner

## 2022-05-01 DIAGNOSIS — N6489 Other specified disorders of breast: Secondary | ICD-10-CM

## 2022-08-06 ENCOUNTER — Ambulatory Visit
Admission: RE | Admit: 2022-08-06 | Discharge: 2022-08-06 | Disposition: A | Discharge status: Home / Self Care | Payer: BLUE CROSS/BLUE SHIELD | Source: Ambulatory Visit | Attending: Family Medicine | Admitting: Family Medicine

## 2022-08-06 ENCOUNTER — Other Ambulatory Visit: Payer: Self-pay | Admitting: Family Medicine

## 2022-08-06 DIAGNOSIS — R062 Wheezing: Secondary | ICD-10-CM

## 2023-05-08 ENCOUNTER — Ambulatory Visit: Payer: Self-pay | Admitting: Nurse Practitioner

## 2023-05-08 ENCOUNTER — Other Ambulatory Visit: Payer: Self-pay | Admitting: Nurse Practitioner

## 2023-05-08 DIAGNOSIS — N6489 Other specified disorders of breast: Secondary | ICD-10-CM

## 2023-05-08 NOTE — Telephone Encounter (Signed)
  Chief Complaint: rash, itching Symptoms: red rash to abdomen/back/arms, severe itching Frequency: constant x 1 week Pertinent Negatives: Patient denies fever, purple color to rash, blisters, facial swelling/tongue swelling, sore throat, headaches, difficulty breathing Disposition: [] ED /[x] Urgent Care (no appt availability in office) / [] Appointment(In office/virtual)/ []  [facility]/ [] Home Care/ [] Refused Recommended Disposition /[] [facility]/ []  Follow-up with PCP Additional Notes: Patient states she developed a rash while at [place] last week. She states she has tried Benadryl and Hydrocortisone cream without relief. No acute new patient appointments available at patient's preferred location. Call agent scheduled patient for new patient appointment and confirmed with patient. Patient agreeable to go to urgent care today or tomorrow for acute symptoms.  Copied from CRM [id number]. Topic: Clinical - Red Word Triage >> May 08, 2023  8:28 AM Elizebeth Brooking wrote: [facility] that prompted transfer to Nurse Triage: Patient stated she's not sure if she is having a allergic reaction she stated she has a rash and it is itching very bad Reason for Disposition  SEVERE itching (i.e., interferes with sleep, normal activities or school)  Answer Assessment - Initial Assessment Questions 1. APPEARANCE of RASH: "Describe the rash." (e.g., spots, blisters, raised areas, skin peeling, scaly)     Painful, itchy, red, raised areas.  2. SIZE: "How big are the spots?" (e.g., tip of pen, eraser, coin; inches, centimeters)     Covers entire back and sides.  3. LOCATION: "Where is the rash located?"     Back, abdomen, states now going to arms.  4. COLOR: "What color is the rash?" (Note: It is difficult to assess rash color in people with darker-colored skin. When this situation occurs, simply ask the caller to describe what they see.)     Red.  5. ONSET: "When did the rash begin?"      04/30/23.  6. FEVER: "Do you have a fever?" If Yes, ask: "What is your temperature, how was it measured, and when did it start?"     Denies.  7. ITCHING: "Does the rash itch?" If Yes, ask: "How bad is the itch?" (Scale 1-10; or mild, moderate, severe)     8/10.  8. CAUSE: "What do you think is causing the rash?"     Unsure.  9. MEDICINE FACTORS: "Have you started any new medicines within the last 2 weeks?" (e.g., antibiotics)      Denies.  10. OTHER SYMPTOMS: "Do you have any other symptoms?" (e.g., dizziness, headache, sore throat, joint pain)       Denies.  11. PREGNANCY: "Is there any chance you are pregnant?" "When was your last menstrual period?"       LMP 1 week ago.  Protocols used: Rash or Redness - [facility]

## 2023-06-05 ENCOUNTER — Ambulatory Visit
Admission: RE | Admit: 2023-06-05 | Discharge: 2023-06-05 | Disposition: A | Discharge status: Home / Self Care | Payer: Medicaid Other | Source: Ambulatory Visit | Attending: Nurse Practitioner | Admitting: Nurse Practitioner

## 2023-06-05 ENCOUNTER — Other Ambulatory Visit: Payer: Self-pay | Admitting: Nurse Practitioner

## 2023-06-05 DIAGNOSIS — N6489 Other specified disorders of breast: Secondary | ICD-10-CM

## 2023-06-05 DIAGNOSIS — R232 Flushing: Secondary | ICD-10-CM | POA: Diagnosis not present

## 2023-06-05 DIAGNOSIS — F3281 Premenstrual dysphoric disorder: Secondary | ICD-10-CM | POA: Diagnosis not present

## 2023-06-05 DIAGNOSIS — R599 Enlarged lymph nodes, unspecified: Secondary | ICD-10-CM

## 2023-06-05 DIAGNOSIS — R928 Other abnormal and inconclusive findings on diagnostic imaging of breast: Secondary | ICD-10-CM | POA: Diagnosis not present

## 2023-06-05 DIAGNOSIS — Z01419 Encounter for gynecological examination (general) (routine) without abnormal findings: Secondary | ICD-10-CM | POA: Diagnosis not present

## 2023-06-06 ENCOUNTER — Encounter: Payer: Self-pay | Admitting: Family Medicine

## 2023-06-06 ENCOUNTER — Ambulatory Visit: Payer: Medicaid Other | Admitting: Family Medicine

## 2023-06-06 VITALS — BP 138/80 | HR 70 | Temp 98.5°F | Ht 64.0 in | Wt 158.8 lb

## 2023-06-06 DIAGNOSIS — Z13220 Encounter for screening for lipoid disorders: Secondary | ICD-10-CM | POA: Diagnosis not present

## 2023-06-06 DIAGNOSIS — R062 Wheezing: Secondary | ICD-10-CM

## 2023-06-06 DIAGNOSIS — R0982 Postnasal drip: Secondary | ICD-10-CM | POA: Insufficient documentation

## 2023-06-06 DIAGNOSIS — R635 Abnormal weight gain: Secondary | ICD-10-CM | POA: Diagnosis not present

## 2023-06-06 DIAGNOSIS — R739 Hyperglycemia, unspecified: Secondary | ICD-10-CM

## 2023-06-06 LAB — TSH: TSH: 2.3 u[IU]/mL (ref 0.35–5.50)

## 2023-06-06 LAB — COMPREHENSIVE METABOLIC PANEL
ALT: 15 U/L (ref 0–35)
AST: 15 U/L (ref 0–37)
Albumin: 4.4 g/dL (ref 3.5–5.2)
Alkaline Phosphatase: 69 U/L (ref 39–117)
BUN: 7 mg/dL (ref 6–23)
CO2: 27 meq/L (ref 19–32)
Calcium: 9 mg/dL (ref 8.4–10.5)
Chloride: 104 meq/L (ref 96–112)
Creatinine, Ser: 0.63 mg/dL (ref 0.40–1.20)
GFR: 112.51 mL/min (ref >60.00)
Glucose, Bld: 90 mg/dL (ref 70–99)
Potassium: 4.1 meq/L (ref 3.5–5.1)
Sodium: 139 meq/L (ref 135–145)
Total Bilirubin: 0.4 mg/dL (ref 0.2–1.2)
Total Protein: 7.2 g/dL (ref 6.0–8.3)

## 2023-06-06 LAB — CBC WITH DIFFERENTIAL/PLATELET
Basophils Absolute: 0 10*3/uL (ref 0.0–0.1)
Basophils Relative: 0.2 % (ref 0.0–3.0)
Eosinophils Absolute: 0.2 10*3/uL (ref 0.0–0.7)
Eosinophils Relative: 2.7 % (ref 0.0–5.0)
HCT: 42.6 % (ref 36.0–46.0)
Hemoglobin: 14.3 g/dL (ref 12.0–15.0)
Lymphocytes Relative: 27.8 % (ref 12.0–46.0)
Lymphs Abs: 2.2 10*3/uL (ref 0.7–4.0)
MCHC: 33.6 g/dL (ref 30.0–36.0)
MCV: 96.5 fl (ref 78.0–100.0)
Monocytes Absolute: 0.4 10*3/uL (ref 0.1–1.0)
Monocytes Relative: 5.5 % (ref 3.0–12.0)
Neutro Abs: 4.9 10*3/uL (ref 1.4–7.7)
Neutrophils Relative %: 63.8 % (ref 43.0–77.0)
Platelets: 269 10*3/uL (ref 150.0–400.0)
RBC: 4.41 Mil/uL (ref 3.87–5.11)
RDW: 13.3 % (ref 11.5–15.5)
WBC: 7.8 10*3/uL (ref 4.0–10.5)

## 2023-06-06 LAB — LIPID PANEL
Cholesterol: 142 mg/dL (ref 0–200)
HDL: 49.7 mg/dL (ref >39.00)
LDL Cholesterol: 78 mg/dL (ref 0–99)
NonHDL: 92.35
Total CHOL/HDL Ratio: 3
Triglycerides: 70 mg/dL (ref 0.0–149.0)
VLDL: 14 mg/dL (ref 0.0–40.0)

## 2023-06-06 LAB — HEMOGLOBIN A1C: Hgb A1c MFr Bld: 5 % (ref 4.6–6.5)

## 2023-06-06 MED ORDER — ALBUTEROL SULFATE HFA 108 (90 BASE) MCG/ACT IN AERS: 2.0000 | INHALATION_SPRAY | Freq: Four times a day (QID) | RESPIRATORY_TRACT | 2 refills | Status: AC | PRN

## 2023-06-06 NOTE — Patient Instructions (Signed)
 Flonase 1 spray each nostril every day before bed.

## 2023-06-06 NOTE — Progress Notes (Signed)
 New Patient Office Visit  Subjective   Patient ID: Kristine [name], female    DOB: [birth year]  Age: 38 y.o. MRN: [id number]  CC:  Chief Complaint  Patient presents with   Establish Care    HPI [name] presents to establish care Pt was being seen by Deboraha Sprang, states she just had her GYN exam, states that she is getting breast biopsy next week for an enlarged LN.    Pt reports a history of chronic cough, states that she is a smoker but she also has a lot of drainage at night and her cough is productive in the morning. No problems with chest tightness. Pt reports she smokes about 1/2 ppd, states she quit smoking for about 4 years, but she has been under alot of stress this year and she restarted about a year ago.    I reviewed her GYN note and her last set of labs, her blood sugar was elevated on the set of labs and we discussed this.   I have reviewed all aspects of the patient's medical history including social, family, and surgical history.   Current Outpatient Medications  Medication Instructions   albuterol (VENTOLIN HFA) 108 (90 Base) MCG/ACT inhaler 2 puffs, Inhalation, Every 6 hours PRN    Past Medical History:  Diagnosis Date   Medical history non-contributory     Past Surgical History:  Procedure Laterality Date   DILATION AND CURETTAGE OF UTERUS  2005    Family History  Problem Relation Age of Onset   Skin cancer Mother    Colon cancer Father    Cancer Maternal Grandmother    Lung cancer Paternal Grandmother    Heart attack Paternal Grandfather     Social History   Socioeconomic History   Marital status: Single    Spouse name: Not on file   Number of children: Not on file   Years of education: Not on file   Highest education level: Not on file  Occupational History   Not on file  Tobacco Use   Smoking status: Every Day    Current packs/day: 1.00    Average packs/day: 1 pack/day for 10.0 years (10.0 ttl pk-yrs)    Types: Cigarettes    Smokeless tobacco: Never  Vaping Use   Vaping status: Never Used  Substance and Sexual Activity   Alcohol use: Yes    Comment: once a week   Drug use: Yes    Types: Marijuana   Sexual activity: Yes    Birth control/protection: None    Comment: spouse had vasectomy  Other Topics Concern   Not on file  Social History Narrative   Not on file   Social Drivers of Health   Financial Resource Strain: Not on file  Food Insecurity: Not on file  Transportation Needs: Not on file  Physical Activity: Not on file  Stress: Not on file  Social Connections: Not on file  Intimate Partner Violence: Not on file    Review of Systems  All other systems reviewed and are negative.       Objective   BP 138/80   Pulse 70   Temp 98.5 F (36.9 C) (Oral)   Ht 5\' 4"  (1.626 m)   Wt 158 lb 12.8 oz (72 kg)   LMP 06/01/2023 (Exact Date)   SpO2 99%   BMI 27.26 kg/m   Physical Exam Vitals reviewed.  Constitutional:      Appearance: Normal appearance. She is well-groomed and normal weight.  Eyes:     Conjunctiva/sclera: Conjunctivae normal.  Neck:     Thyroid: No thyromegaly.  Cardiovascular:     Rate and Rhythm: Normal rate and regular rhythm.     Pulses: Normal pulses.     Heart sounds: S1 normal and S2 normal.  Pulmonary:     Effort: Pulmonary effort is normal.     Breath sounds: Normal air entry. Wheezing (scattered inspiratory wheezes throughout the lung fields) present.  Abdominal:     General: Bowel sounds are normal.  Musculoskeletal:     Right lower leg: No edema.     Left lower leg: No edema.  Neurological:     Mental Status: She is alert and oriented to person, place, and time. Mental status is at baseline.     Gait: Gait is intact.  Psychiatric:        Mood and Affect: Mood and affect normal.        Speech: Speech normal.        Behavior: Behavior normal.        Judgment: Judgment normal.         Assessment & Plan:  PND (post-nasal drip) Assessment &  Plan: Most likely chronic rhinitis from smoking, recommended OTC flonase daily before bedtime   Wheezing Assessment & Plan: Pt is wheezing on exam, developing a chronic cough, likely due to lung inflammation from chronic smoking. We discussed this at length and I counseled the patient to quit smoking. I offered a trial of albuterol inhaler to help with the coughing as well, will check CBC and CMP today  Orders: -     Albuterol Sulfate HFA; Inhale 2 puffs into the lungs every 6 (six) hours as needed for wheezing or shortness of breath.  Dispense: 8 g; Refill: 2 -     Comprehensive metabolic panel; Future -     CBC with Differential/Platelet; Future  Lipid screening -     Lipid panel; Future  Hyperglycemia -     Hemoglobin A1c; Future  Weight gain Assessment & Plan: Positive in the ROS, will check TSH today. Could also be from possibly prediabetes/hyperglycemia, checking A1C also with the labs to rule out this condition.   Orders: -     TSH; Future    Return in about 6 months (around 12/04/2023) for annual physical exam.   Karie Georges, MD

## 2023-06-08 ENCOUNTER — Encounter: Payer: Self-pay | Admitting: Family Medicine

## 2023-06-09 DIAGNOSIS — R062 Wheezing: Secondary | ICD-10-CM | POA: Insufficient documentation

## 2023-06-09 DIAGNOSIS — R635 Abnormal weight gain: Secondary | ICD-10-CM | POA: Insufficient documentation

## 2023-06-09 NOTE — Assessment & Plan Note (Signed)
 Most likely chronic rhinitis from smoking, recommended OTC flonase daily before bedtime

## 2023-06-09 NOTE — Assessment & Plan Note (Signed)
 Pt is wheezing on exam, developing a chronic cough, likely due to lung inflammation from chronic smoking. We discussed this at length and I counseled the patient to quit smoking. I offered a trial of albuterol inhaler to help with the coughing as well, will check CBC and CMP today

## 2023-06-09 NOTE — Assessment & Plan Note (Addendum)
 Positive in the ROS, will check TSH today. Could also be from possibly prediabetes/hyperglycemia, checking A1C also with the labs to rule out this condition.

## 2023-06-12 ENCOUNTER — Ambulatory Visit
Admission: RE | Admit: 2023-06-12 | Discharge: 2023-06-12 | Disposition: A | Discharge status: Home / Self Care | Source: Ambulatory Visit | Attending: Nurse Practitioner | Admitting: Nurse Practitioner

## 2023-06-12 ENCOUNTER — Other Ambulatory Visit: Payer: Self-pay | Admitting: Nurse Practitioner

## 2023-06-12 ENCOUNTER — Ambulatory Visit
Admission: RE | Admit: 2023-06-12 | Discharge: 2023-06-12 | Disposition: A | Discharge status: Home / Self Care | Payer: Medicaid Other | Source: Ambulatory Visit | Attending: Nurse Practitioner | Admitting: Nurse Practitioner

## 2023-06-12 DIAGNOSIS — N6489 Other specified disorders of breast: Secondary | ICD-10-CM

## 2023-06-12 DIAGNOSIS — R599 Enlarged lymph nodes, unspecified: Secondary | ICD-10-CM

## 2023-06-12 DIAGNOSIS — R590 Localized enlarged lymph nodes: Secondary | ICD-10-CM | POA: Diagnosis not present

## 2023-07-15 ENCOUNTER — Ambulatory Visit: Admitting: Family Medicine

## 2023-08-26 DIAGNOSIS — Z111 Encounter for screening for respiratory tuberculosis: Secondary | ICD-10-CM | POA: Diagnosis not present

## 2023-10-23 ENCOUNTER — Ambulatory Visit: Payer: Self-pay

## 2023-10-23 NOTE — Telephone Encounter (Signed)
 FYI Only or Action Required?: Action required by provider: request for appointment.  Patient was last seen in primary care on 06/06/2023 by Ozell Heron HERO, MD.  Called Nurse Triage reporting Skin infection.  Symptoms began a week ago.  Interventions attempted: Ice/heat application.  Symptoms are: unchanged. Boil left thigh, 50 cent size, no drainage, no fever. Pt. [place]. Instructed UC, requests appointment Monday.  Triage Disposition: See Physician Within 24 Hours  Patient/caregiver understands and will follow disposition?:     Copied from CRM [id number]. Topic: Clinical - Red Word Triage >> Oct 23, 2023 11:45 AM Chasity T wrote: [facility] that prompted transfer to Nurse Triage: patient states she is having an ingrown under her skin that is swelling. Not a boil but it is hurting her and wants toknow what she can do while out of town. >> Oct 23, 2023 11:42 AM Chasity T wrote: Ingrown on thigh with swelling and is hard for her to walk. States she is out of town and is wanting to know what she can do to make her more comfortable until she gets back home. Reason for Disposition  [1] Boil > 1/2 inch across (> 12 mm; larger than a marble) AND [2] center is soft or pus colored  Answer Assessment - Initial Assessment Questions 1. APPEARANCE of BOIL: What does the boil look like?      Lump 2. LOCATION: Where is the boil located?      Left thigh 3. NUMBER: How many boils are there?      1 4. SIZE: How big is the boil? (e.g., inches, cm; compare to size of a coin or other object)     50 cent piece 5. ONSET: When did the boil start?     1 week 6. PAIN: Is there any pain? If Yes, ask: How bad is the pain?   (Scale 1-10; or mild, moderate, severe)     Moderate 7. FEVER: Do you have a fever? If Yes, ask: What is it, how was it measured, and when did it start?      no 8. SOURCE: Have you been around anyone with boils or other Staph infections? Have you  ever had boils before?     no 9. OTHER SYMPTOMS: Do you have any other symptoms? (e.g., shaking chills, weakness, rash elsewhere on body)     no 10. PREGNANCY: Is there any chance you are pregnant? When was your last menstrual period?       no  Protocols used: Boil (Skin Abscess)-A-AH

## 2023-10-23 NOTE — Telephone Encounter (Signed)
 Duplicate, see previous triage

## 2023-10-23 NOTE — Telephone Encounter (Signed)
 Pt has appt, ok to close

## 2023-10-27 ENCOUNTER — Ambulatory Visit: Admitting: Family Medicine

## 2023-11-21 ENCOUNTER — Other Ambulatory Visit: Payer: Self-pay | Admitting: Medical Genetics

## 2023-12-18 ENCOUNTER — Other Ambulatory Visit

## 2024-01-20 ENCOUNTER — Other Ambulatory Visit: Payer: Self-pay | Admitting: Medical Genetics

## 2024-01-20 DIAGNOSIS — Z006 Encounter for examination for normal comparison and control in clinical research program: Secondary | ICD-10-CM

## 2024-03-08 ENCOUNTER — Emergency Department (HOSPITAL_BASED_OUTPATIENT_CLINIC_OR_DEPARTMENT_OTHER)
Admission: EM | Admit: 2024-03-08 | Discharge: 2024-03-08 | Disposition: A | Discharge status: Home / Self Care | Attending: Emergency Medicine | Admitting: Emergency Medicine

## 2024-03-08 ENCOUNTER — Encounter (HOSPITAL_BASED_OUTPATIENT_CLINIC_OR_DEPARTMENT_OTHER): Payer: Self-pay

## 2024-03-08 ENCOUNTER — Emergency Department (HOSPITAL_BASED_OUTPATIENT_CLINIC_OR_DEPARTMENT_OTHER): Admitting: Radiology

## 2024-03-08 ENCOUNTER — Other Ambulatory Visit: Payer: Self-pay

## 2024-03-08 DIAGNOSIS — S63631A Sprain of interphalangeal joint of left index finger, initial encounter: Secondary | ICD-10-CM | POA: Insufficient documentation

## 2024-03-08 DIAGNOSIS — W290XXA Contact with powered kitchen appliance, initial encounter: Secondary | ICD-10-CM | POA: Diagnosis not present

## 2024-03-08 DIAGNOSIS — S6992XA Unspecified injury of left wrist, hand and finger(s), initial encounter: Secondary | ICD-10-CM

## 2024-03-08 DIAGNOSIS — S61301A Unspecified open wound of left index finger with damage to nail, initial encounter: Secondary | ICD-10-CM | POA: Insufficient documentation

## 2024-03-08 DIAGNOSIS — Z0189 Encounter for other specified special examinations: Secondary | ICD-10-CM | POA: Diagnosis not present

## 2024-03-08 DIAGNOSIS — Z23 Encounter for immunization: Secondary | ICD-10-CM | POA: Diagnosis not present

## 2024-03-08 DIAGNOSIS — S63639A Sprain of interphalangeal joint of unspecified finger, initial encounter: Secondary | ICD-10-CM

## 2024-03-08 MED ORDER — LIDOCAINE HCL (PF) 1 % IJ SOLN
10.0000 mL | Freq: Once | INTRAMUSCULAR | Status: AC
  Administered 2024-03-08: 10 mL
  Filled 2024-03-08: qty 10

## 2024-03-08 MED ORDER — OXYCODONE HCL 5 MG PO TABS: 5.0000 mg | ORAL_TABLET | Freq: Three times a day (TID) | ORAL | 0 refills | Status: AC | PRN

## 2024-03-08 MED ORDER — OXYCODONE HCL 5 MG PO TABS
5.0000 mg | ORAL_TABLET | Freq: Once | ORAL | Status: AC
  Administered 2024-03-08: 5 mg via ORAL
  Filled 2024-03-08: qty 1

## 2024-03-08 MED ORDER — TETANUS-DIPHTH-ACELL PERTUSSIS 5-2-15.5 LF-MCG/0.5 IM SUSP
0.5000 mL | Freq: Once | INTRAMUSCULAR | Status: AC
  Administered 2024-03-08: 0.5 mL via INTRAMUSCULAR
  Filled 2024-03-08: qty 0.5

## 2024-03-08 NOTE — ED Triage Notes (Signed)
 Reports finger injury on Thanksgiving. Reports getting left index finger in a blender. Today at work, someone brush against the finger and the nail came off.   Swelling & redness noted.

## 2024-03-08 NOTE — ED Notes (Addendum)
PROVIDER AT BEDSIDE.

## 2024-03-08 NOTE — ED Notes (Signed)
 Reviewed AVS/discharge instruction with patient. Time allotted for and all questions answered. Patient is agreeable for d/c and escorted to ed exit by staff.

## 2024-03-08 NOTE — ED Provider Notes (Signed)
 [place] EMERGENCY DEPARTMENT AT [facility] Provider Note   CSN: [id number] Arrival date & time: 03/08/24  9190     History Chief Complaint  Patient presents with   Finger Injury    HPI: Kristine [name] is a 38 y.o. female with no pertinent history who presents complaining of finger injury. Patient arrived via POV.  History provided by patient.  No interpreter required during this encounter.  Patient reports that 4 days ago on Thanksgiving she was using a blender and taking it apart when her finger became caught and the mechanism on turbo mode, and she suffered injury to her distal left index finger.  Reports that since then it has been throbbing, swollen, and she has been unable to flex her DIP.  Reports that she tried to wrap it up, however that would make the pain worse.  Reports that today she was at work when it was accidentally jammed by a another car employee, and her nail was lifted up and backwards from her nailbed causing increased pain, therefore she decided come to the emergency department for further evaluation.  Has not had fever, chills, pain to other parts of the hand, not up-to-date on Tdap within the past 5 years per patient.  Patient's recorded medical, surgical, social, medication list and allergies were reviewed in the Snapshot window as part of the initial history.   Prior to Admission medications   Medication Sig Start Date End Date Taking? Authorizing Provider  oxyCODONE (ROXICODONE) 5 MG immediate release tablet Take 1 tablet (5 mg total) by mouth every 8 (eight) hours as needed for up to 3 days for severe pain (pain score 7-10). 03/08/24 03/11/24 Yes Rogelia Jerilynn RAMAN, MD  albuterol  (VENTOLIN  HFA) 108 (90 Base) MCG/ACT inhaler Inhale 2 puffs into the lungs every 6 (six) hours as needed for wheezing or shortness of breath. 06/06/23   Ozell Heron CHRISTELLA, MD     Allergies: Patient has no known allergies.   Review of Systems   ROS as per HPI  Physical  Exam Updated Vital Signs BP 112/65   Pulse 77   Temp 98.5 F (36.9 C) (Oral)   Resp 15   SpO2 99%  Physical Exam Vitals and nursing note reviewed.  Constitutional:      General: She is not in acute distress.    Appearance: She is well-developed.  HENT:     Head: Normocephalic and atraumatic.  Eyes:     Conjunctiva/sclera: Conjunctivae normal.  Cardiovascular:     Rate and Rhythm: Normal rate and regular rhythm.     Heart sounds: No murmur heard. Pulmonary:     Effort: Pulmonary effort is normal. No respiratory distress.     Breath sounds: Normal breath sounds.  Abdominal:     Palpations: Abdomen is soft.     Tenderness: There is no abdominal tenderness.  Musculoskeletal:        General: No swelling.     Cervical back: Neck supple.  Skin:    General: Skin is warm and dry.     Capillary Refill: Capillary refill takes less than 2 seconds.  Neurological:     Mental Status: She is alert.  Psychiatric:        Mood and Affect: Mood normal.          ED Course/ Medical Decision Making/ A&P    Procedures Nail Removal  Date/Time: 03/08/2024 12:15 PM  Performed by: Rogelia Jerilynn RAMAN, MD Authorized by: Rogelia Jerilynn RAMAN, MD  Consent:    Consent obtained:  Verbal   Consent given by:  Patient   Risks, benefits, and alternatives were discussed: yes     Risks discussed:  Bleeding, incomplete removal, infection, pain and permanent nail deformity   Alternatives discussed:  No treatment Universal protocol:    Patient identity confirmed:  Verbally with patient Pre-procedure details:    Skin preparation:  Chlorhexidine Procedure details:    Location:  Hand   Hand location:  L index finger Anesthesia:    Anesthesia method:  Nerve block   Block location:  3 sided ring block at base of left index finger   Block needle gauge:  27 G   Block anesthetic:  Lidocaine 1% w/o epi   Block technique:  3 sided ring block   Block injection procedure:  Anatomic landmarks  identified, introduced needle, negative aspiration for blood and incremental injection   Block outcome:  Anesthesia achieved Nail Removal:    Nail removed:  Complete   Nail bed repaired: yes     Nail bed repair material:  Tissue adhesive   Stented with:  Sterile foil pack from plain gut sutures Post-procedure details:    Dressing:  Xeroform gauze, tube gauze and splint   Procedure completion:  Tolerated well, no immediate complications    Medications Ordered in ED Medications  oxyCODONE (Oxy IR/ROXICODONE) immediate release tablet 5 mg (5 mg Oral Given 03/08/24 0837)  Tdap (ADACEL) injection 0.5 mL (0.5 mLs Intramuscular Given 03/08/24 0839)  lidocaine (PF) (XYLOCAINE) 1 % injection 10 mL (10 mLs Infiltration Given by Other 03/08/24 1052)    Medical Decision Making:   Kristine [name] is a 38 y.o. female who presents for finger pain as per above.  Physical exam is pertinent for partial avulsion of the left index finger nail, inability to actively flex or extend DIP.   The differential includes but is not limited to tendon injury, fracture, dislocation, nail avulsion,.  Independent historian: None  External data reviewed: No pertinent external data  Labs: Not indicated  Radiology: Ordered, Independent interpretation, Details: Do not appreciate displaced fracture or dislocation of the left finger, and All images reviewed independently.  Agree with radiology report at this time.   DG Finger Index Left Result Date: 03/08/2024 CLINICAL DATA:  Partial nail avulsion, inability to flex/extend DIP EXAM: LEFT INDEX FINGER 2+V COMPARISON:  None Available. FINDINGS: No acute fracture or dislocation. There is no evidence of arthropathy or other focal bone abnormality. Radiographic findings consistent with patient's partial nail avulsion. No radiopaque foreign body. IMPRESSION: No acute fracture or dislocation. Electronically Signed   By: Rogelia Myers [name]   On: 03/08/2024 08:58    EKG/Medicine  tests: Not indicated EKG Interpretation:                  Interventions: Tdap, oxycodone, lidocaine  See the EMR for full details regarding lab and imaging results.  Patient presents for nailbed injury and finger injury.  Patient does not have laceration, no gross deformity, however patient is unable to actively flex or extend her DIP, held at baseline in extended position, and has overt partial avulsion of the nail.  Given patient has been unable to mobilize her DIP for several days, concern for tendon injury versus fracture/dislocation.  Given nailbed avulsion, will update Tdap as well as give oxycodone for pain control.  X-ray obtained and does not reveal fracture, therefore presentation is most concerning for underlying tendon injury.  Did speak with Dr. Delene  who recommended nail removal, splinting in extension, and follow-up in his clinic. Removal performed as per procedure note above.  Patient tolerated well.  Patient amenable to plan for discharge with outpatient follow-up.   Presentation is most consistent with acute complicated illness  Discussion of management or test interpretations with external provider(s): Dr. Delene, hand surgery   Risk Drugs:OTC drugs and Prescription drug management  Disposition: DISCHARGE: I believe that the patient is safe for discharge home with outpatient follow-up. Patient was informed of all pertinent physical exam, laboratory, and imaging findings. Patient's suspected etiology of their symptom presentation was discussed with the patient and all questions were answered. We discussed following up with PCP, hand surgery. I provided thorough ED return precautions. The patient feels safe and comfortable with this plan.  MDM generated using voice dictation software and may contain dictation errors.  Please contact me for any clarification or with any questions.  Clinical Impression:  1. Injury to fingernail of left hand, initial encounter   2.  Traumatic rupture of tendon of distal interphalangeal (DIP) joint of finger, initial encounter      Discharge   Final Clinical Impression(s) / ED Diagnoses Final diagnoses:  Injury to fingernail of left hand, initial encounter  Traumatic rupture of tendon of distal interphalangeal (DIP) joint of finger, initial encounter    Rx / DC Orders ED Discharge Orders          Ordered    oxyCODONE (ROXICODONE) 5 MG immediate release tablet  Every 8 hours PRN        03/08/24 1214    Ambulatory referral to Orthopedic Surgery        03/08/24 1215             Rogelia Jerilynn RAMAN, MD 03/09/24 [phone]

## 2024-03-08 NOTE — Discharge Instructions (Addendum)
 Kristine [name]  Thank you for allowing us  to take care of you today.  You came to the Emergency Department today because you injured your finger on a blender several days ago, and today your nail was pushed backwards while you are at work.  Here in the emergency department we got an x-ray which does not show any evidence of a broken bone.  We took off your nail, and put a splint in place to keep your nail fold open.  You are unable to bend the tip of your finger, nor actively extend it once it is bent, therefore you likely have a underlying tendon injury.  We spoke with the hand doctor, please keep the splint on the finger at all times until you follow-up with them.  You can use the arm sling as needed to help with throbbing.  You can take Tylenol and ibuprofen  as needed for pain control.  We will send in a few doses of oxycodone for breakthrough pain.  We will give you a referral to follow-up with the hand surgeon To-Do: 1. Please follow-up with your primary doctor within 7 days / as soon as possible.   Please return to the Emergency Department or call 911 if you experience have worsening of your symptoms, or do not get better, pain, redness, swelling, difficulty moving or feeling your finger beyond the next 4 hours, chest pain, shortness of breath, severe or significantly worsening pain, high fever, severe confusion, pass out or have any reason to think that you need emergency medical care.   We hope you feel better soon.   Mitzie Later, MD Department of Emergency Medicine [facility]

## 2024-03-11 ENCOUNTER — Ambulatory Visit: Admitting: Orthopedic Surgery

## 2024-03-11 DIAGNOSIS — S6992XA Unspecified injury of left wrist, hand and finger(s), initial encounter: Secondary | ICD-10-CM | POA: Diagnosis not present

## 2024-03-19 ENCOUNTER — Inpatient Hospital Stay
Admission: RE | Admit: 2024-03-19 | Discharge: 2024-03-19 | Attending: Nurse Practitioner | Admitting: Nurse Practitioner

## 2024-03-19 DIAGNOSIS — R590 Localized enlarged lymph nodes: Secondary | ICD-10-CM | POA: Diagnosis not present

## 2024-03-19 DIAGNOSIS — R599 Enlarged lymph nodes, unspecified: Secondary | ICD-10-CM
